# Patient Record
Sex: Male | Born: 1985 | Race: White | Hispanic: No | Smoking: Former smoker
Health system: Southern US, Community
[De-identification: ages and names within clinical notes are randomized; demographics above are authoritative.]

## PROBLEM LIST (undated history)

## (undated) ENCOUNTER — Emergency Department (HOSPITAL_COMMUNITY): Payer: Commercial Managed Care - HMO | Source: Home / Self Care

## (undated) DIAGNOSIS — K5900 Constipation, unspecified: Secondary | ICD-10-CM

## (undated) HISTORY — PX: WISDOM TOOTH EXTRACTION: SHX21

---
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
## 2012-09-23 ENCOUNTER — Ambulatory Visit (INDEPENDENT_AMBULATORY_CARE_PROVIDER_SITE_OTHER): Payer: BC Managed Care – PPO | Admitting: Family Medicine

## 2012-09-23 ENCOUNTER — Encounter: Payer: Self-pay | Admitting: Family Medicine

## 2012-09-23 VITALS — BP 139/87 | HR 89 | Temp 97.5°F | Ht 67.0 in | Wt 206.0 lb

## 2012-09-23 DIAGNOSIS — L723 Sebaceous cyst: Secondary | ICD-10-CM

## 2012-09-23 NOTE — Patient Instructions (Signed)

## 2012-09-23 NOTE — Progress Notes (Signed)
  Subjective:    Patient ID: Luis Simpson, male    DOB: January 31, 1986, 27 y.o.   MRN: [id number]  HPI This 27 y.o. male presents for evaluation of cyst left forearm.  He states he has a cyst on his Left forearm and he wants it cut out.  He has recently squeezed his forearm and got a seed out Of the cyst but feels like there is more in there.   Review of Systems C.o cyst left forearm.   No chest pain, SOB, HA, dizziness, vision change, N/V, diarrhea, constipation, dysuria, urinary urgency or frequency, myalgias, arthralgias or rash.  Objective:   Physical Exam  Vital signs noted  Well developed well nourished male.  HEENT - Head atraumatic Normocephalic Respiratory - Lungs CTA bilateral Cardiac - RRR S1 and S2 without murmur Skin - Left forearm with small cyst left forearm.  Left forearm is prepped with  Alcohol and lidocaine with 2% epi injected in a wheal fashion and then when Adequate anesthesia is acquired a small incision is made and no DC or cyst  Is appreciated and no discharge.  Bandaid is applied and patient reassured that The cyst is resolved.      Assessment & Plan:  Sebaceous cyst Incision and drainage performed and patient advised to follow up prn.

## 2012-09-24 ENCOUNTER — Ambulatory Visit: Payer: Self-pay | Admitting: Family Medicine

## 2013-01-26 ENCOUNTER — Ambulatory Visit (INDEPENDENT_AMBULATORY_CARE_PROVIDER_SITE_OTHER): Payer: BC Managed Care – PPO | Admitting: Family Medicine

## 2013-01-26 ENCOUNTER — Encounter: Payer: Self-pay | Admitting: Family Medicine

## 2013-01-26 VITALS — BP 145/76 | HR 114 | Temp 98.1°F | Ht 67.0 in | Wt 207.0 lb

## 2013-01-26 DIAGNOSIS — Z716 Tobacco abuse counseling: Secondary | ICD-10-CM

## 2013-01-26 DIAGNOSIS — J029 Acute pharyngitis, unspecified: Secondary | ICD-10-CM

## 2013-01-26 DIAGNOSIS — J069 Acute upper respiratory infection, unspecified: Secondary | ICD-10-CM

## 2013-01-26 DIAGNOSIS — Z7189 Other specified counseling: Secondary | ICD-10-CM

## 2013-01-26 DIAGNOSIS — F17200 Nicotine dependence, unspecified, uncomplicated: Secondary | ICD-10-CM

## 2013-01-26 LAB — POCT RAPID STREP A (OFFICE): Rapid Strep A Screen: NEGATIVE

## 2013-01-26 MED ORDER — BENZONATATE 100 MG PO CAPS: 100.0000 mg | ORAL_CAPSULE | Freq: Three times a day (TID) | ORAL | Status: DC | PRN

## 2013-01-26 NOTE — Progress Notes (Signed)
   Subjective:    Patient ID: Luis Simpson, male    DOB: [birth year], 27 y.o.   MRN: [id number]  HPI URI Symptoms Onset: 3 days  Description: rhinorrhea, nasal congestion, cough  Modifying factors:  1 ppd smoker, no wheezing   Symptoms Nasal discharge: yes Fever: tmax 99.8  Sore throat: yes Cough: yes Wheezing: no Ear pain: no GI symptoms: no Sick contacts: yes  Red Flags  Stiff neck: no Dyspnea: no Rash: no Swallowing difficulty: no  Sinusitis Risk Factors Headache/face pain: no Double sickening: no tooth pain: no  Allergy Risk Factors Sneezing: no Itchy scratchy throat: no Seasonal symptoms: no  Flu Risk Factors Headache: no muscle aches: no severe fatigue: no     Review of Systems  All other systems reviewed and are negative.       Objective:   Physical Exam  Constitutional: He is oriented to person, place, and time. He appears well-developed and well-nourished.  HENT:  Head: Normocephalic and atraumatic.  Right Ear: External ear normal.  Left Ear: External ear normal.  +nasal erythema, rhinorrhea bilaterally, + post oropharyngeal erythema    Eyes: Conjunctivae are normal. Pupils are equal, round, and reactive to light.  Neck: Normal range of motion.  Cardiovascular: Normal rate and regular rhythm.   Pulmonary/Chest: Effort normal.  Abdominal: Soft. Bowel sounds are normal.  Musculoskeletal: Normal range of motion.  Neurological: He is alert and oriented to person, place, and time.  Skin: Skin is warm.          Assessment & Plan:  Sore throat - Plan: POCT rapid strep A  URI (upper respiratory infection) - Plan: benzonatate (TESSALON) 100 MG capsule  Rapid strep negative Likely viral illness Discussed supportive care and infectious/resp red flags Tessalon perles for cough Discussed smoking cessation at length.  Follow up as needed.

## 2013-10-27 ENCOUNTER — Telehealth: Payer: Self-pay | Admitting: Family Medicine

## 2013-10-27 NOTE — Telephone Encounter (Signed)
Patient requested appt tomorrow am for pain in throat. appt given with bill oxford for 10am

## 2013-10-28 ENCOUNTER — Encounter: Payer: Self-pay | Admitting: Family Medicine

## 2013-10-28 ENCOUNTER — Ambulatory Visit (INDEPENDENT_AMBULATORY_CARE_PROVIDER_SITE_OTHER): Payer: BC Managed Care – PPO | Admitting: Family Medicine

## 2013-10-28 VITALS — BP 129/86 | HR 90 | Temp 97.3°F | Ht 67.0 in | Wt 211.0 lb

## 2013-10-28 DIAGNOSIS — J028 Acute pharyngitis due to other specified organisms: Secondary | ICD-10-CM

## 2013-10-28 DIAGNOSIS — J029 Acute pharyngitis, unspecified: Secondary | ICD-10-CM

## 2013-10-28 MED ORDER — FLUCONAZOLE 150 MG PO TABS: 150.0000 mg | ORAL_TABLET | Freq: Once | ORAL | Status: DC

## 2013-10-28 MED ORDER — AMOXICILLIN 875 MG PO TABS: 875.0000 mg | ORAL_TABLET | Freq: Two times a day (BID) | ORAL | Status: DC

## 2013-10-28 NOTE — Progress Notes (Signed)
   Subjective:    Patient ID: Luis Simpson, male    DOB: 1986/01/04, 28 y.o.   MRN: [id number]  HPI  This 28 y.o. male presents for evaluation of jaw discomfort and sore throat.  He has had wisdom teeth removed and was on abx's.  He still has some jaw pain and he has URI sx's.  He has been on PCN abx's recently.  Review of Systems C/o uri sx's and sore throat   No chest pain, SOB, HA, dizziness, vision change, N/V, diarrhea, constipation, dysuria, urinary urgency or frequency, myalgias, arthralgias or rash.  Objective:   Physical Exam Vital signs noted  Well developed well nourished male.  HEENT - Head atraumatic Normocephalic                Eyes - PERRLA, Conjuctiva - clear Sclera- Clear EOMI                Ears - EAC's Wnl TM's Wnl Gross Hearing WNL                Nose - Nares patent                 Throat - oropharanx injected and tonsils 2 plus Respiratory - Lungs CTA bilateral Cardiac - RRR S1 and S2 without murmur GI - Abdomen soft Nontender and bowel sounds active x 4 Extremities - No edema. Neuro - Grossly intact.  Results for orders placed in visit on 01/26/13  POCT RAPID STREP A (OFFICE)      Result Value Ref Range   Rapid Strep A Screen Negative  Negative       Assessment & Plan:  Acute pharyngitis due to other specified organisms - Plan: amoxicillin (AMOXIL) 875 MG tablet, fluconazole (DIFLUCAN) 150 MG  Po qd x 2 days and repeat after finishing amoxicillin.  WSWG's Push po fluids, rest, tylenol and motrin otc prn as directed for fever, arthralgias, and myalgias.  Follow up prn if sx's continue or persist.  Deatra Canter FNP

## 2013-11-04 ENCOUNTER — Telehealth: Payer: Self-pay | Admitting: Family Medicine

## 2013-11-04 NOTE — Telephone Encounter (Signed)
Please call prescription for Augmentin [phone] one twice daily for infection until complete

## 2013-11-04 NOTE — Telephone Encounter (Signed)
Called in.

## 2013-11-04 NOTE — Telephone Encounter (Signed)
Patient would like to have augmentin called in because the amox is not working at all for him

## 2014-04-15 DIAGNOSIS — Z720 Tobacco use: Secondary | ICD-10-CM | POA: Insufficient documentation

## 2014-04-15 DIAGNOSIS — K219 Gastro-esophageal reflux disease without esophagitis: Secondary | ICD-10-CM | POA: Insufficient documentation

## 2014-04-15 DIAGNOSIS — R202 Paresthesia of skin: Secondary | ICD-10-CM | POA: Insufficient documentation

## 2014-04-15 DIAGNOSIS — E669 Obesity, unspecified: Secondary | ICD-10-CM | POA: Insufficient documentation

## 2014-08-02 DIAGNOSIS — E781 Pure hyperglyceridemia: Secondary | ICD-10-CM | POA: Insufficient documentation

## 2015-09-13 ENCOUNTER — Emergency Department (HOSPITAL_COMMUNITY)
Admission: EM | Admit: 2015-09-13 | Discharge: 2015-09-14 | Disposition: A | Discharge status: Home / Self Care | Payer: Commercial Managed Care - HMO | Attending: Emergency Medicine | Admitting: Emergency Medicine

## 2015-09-13 ENCOUNTER — Encounter (HOSPITAL_COMMUNITY): Payer: Self-pay | Admitting: *Deleted

## 2015-09-13 ENCOUNTER — Telehealth: Payer: Self-pay | Admitting: *Deleted

## 2015-09-13 ENCOUNTER — Ambulatory Visit (INDEPENDENT_AMBULATORY_CARE_PROVIDER_SITE_OTHER): Payer: Commercial Managed Care - HMO | Admitting: Family Medicine

## 2015-09-13 ENCOUNTER — Other Ambulatory Visit: Payer: Self-pay | Admitting: Family Medicine

## 2015-09-13 ENCOUNTER — Encounter: Payer: Self-pay | Admitting: Family Medicine

## 2015-09-13 ENCOUNTER — Ambulatory Visit (INDEPENDENT_AMBULATORY_CARE_PROVIDER_SITE_OTHER): Payer: Commercial Managed Care - HMO

## 2015-09-13 VITALS — BP 132/91 | HR 73 | Temp 97.1°F | Ht 67.0 in | Wt 227.0 lb

## 2015-09-13 DIAGNOSIS — K6289 Other specified diseases of anus and rectum: Secondary | ICD-10-CM

## 2015-09-13 DIAGNOSIS — K5900 Constipation, unspecified: Secondary | ICD-10-CM | POA: Diagnosis not present

## 2015-09-13 DIAGNOSIS — R1030 Lower abdominal pain, unspecified: Secondary | ICD-10-CM

## 2015-09-13 DIAGNOSIS — Z87891 Personal history of nicotine dependence: Secondary | ICD-10-CM | POA: Insufficient documentation

## 2015-09-13 HISTORY — DX: Constipation, unspecified: K59.00

## 2015-09-13 LAB — URINALYSIS, ROUTINE W REFLEX MICROSCOPIC
Bilirubin Urine: NEGATIVE
GLUCOSE, UA: NEGATIVE mg/dL
HGB URINE DIPSTICK: NEGATIVE
Ketones, ur: NEGATIVE mg/dL
LEUKOCYTES UA: NEGATIVE
Nitrite: NEGATIVE
PROTEIN: 30 mg/dL — AB
SPECIFIC GRAVITY, URINE: 1.017 (ref 1.005–1.030)
pH: 7.5 (ref 5.0–8.0)

## 2015-09-13 LAB — COMPREHENSIVE METABOLIC PANEL
ALBUMIN: 4.2 g/dL (ref 3.5–5.0)
ALT: 147 U/L — ABNORMAL HIGH (ref 17–63)
ANION GAP: 7 (ref 5–15)
AST: 70 U/L — AB (ref 15–41)
Alkaline Phosphatase: 71 U/L (ref 38–126)
BUN: 7 mg/dL (ref 6–20)
CHLORIDE: 103 mmol/L (ref 101–111)
CO2: 27 mmol/L (ref 22–32)
Calcium: 9.3 mg/dL (ref 8.9–10.3)
Creatinine, Ser: 0.98 mg/dL (ref 0.61–1.24)
GFR calc Af Amer: >60 mL/min (ref >60)
GFR calc non Af Amer: >60 mL/min (ref >60)
GLUCOSE: 102 mg/dL — AB (ref 65–99)
POTASSIUM: 4.2 mmol/L (ref 3.5–5.1)
SODIUM: 137 mmol/L (ref 135–145)
TOTAL PROTEIN: 7.2 g/dL (ref 6.5–8.1)
Total Bilirubin: 1 mg/dL (ref 0.3–1.2)

## 2015-09-13 LAB — CBC WITH DIFFERENTIAL/PLATELET
BASOS ABS: 0 10*3/uL (ref 0.0–0.1)
Basophils Relative: 0 %
EOS ABS: 0.1 10*3/uL (ref 0.0–0.7)
EOS PCT: 1 %
HCT: 42.8 % (ref 39.0–52.0)
Hemoglobin: 15.5 g/dL (ref 13.0–17.0)
LYMPHS ABS: 1.3 10*3/uL (ref 0.7–4.0)
LYMPHS PCT: 17 %
MCH: 30 pg (ref 26.0–34.0)
MCHC: 36.2 g/dL — AB (ref 30.0–36.0)
MCV: 82.9 fL (ref 78.0–100.0)
MONO ABS: 0.5 10*3/uL (ref 0.1–1.0)
Monocytes Relative: 6 %
Neutro Abs: 6.1 10*3/uL (ref 1.7–7.7)
Neutrophils Relative %: 76 %
PLATELETS: 215 10*3/uL (ref 150–400)
RBC: 5.16 MIL/uL (ref 4.22–5.81)
RDW: 12.7 % (ref 11.5–15.5)
WBC: 8 10*3/uL (ref 4.0–10.5)

## 2015-09-13 LAB — URINE MICROSCOPIC-ADD ON

## 2015-09-13 MED ORDER — HYOSCYAMINE SULFATE 0.125 MG PO TBDP: 0.1250 mg | ORAL_TABLET | ORAL | 2 refills | Status: DC | PRN

## 2015-09-13 MED ORDER — MESALAMINE 1000 MG RE SUPP: 1000.0000 mg | Freq: Two times a day (BID) | RECTAL | 1 refills | Status: DC

## 2015-09-13 MED ORDER — POLYETHYLENE GLYCOL 3350 17 GM/SCOOP PO POWD: 17.0000 g | Freq: Two times a day (BID) | ORAL | 5 refills | Status: DC | PRN

## 2015-09-13 MED ORDER — BUDESONIDE 3 MG PO CPEP: 3.0000 mg | ORAL_CAPSULE | Freq: Two times a day (BID) | ORAL | 0 refills | Status: DC

## 2015-09-13 NOTE — Telephone Encounter (Signed)
Pt notified of RX Verbalizes understanding 

## 2015-09-13 NOTE — Progress Notes (Signed)
Subjective:  Patient ID: Luis Simpson, male    DOB: Aug 10, 1985  Age: 30 y.o. MRN: [id number]  CC: Constipation (x 2-3 days)   HPI Luis Simpson presents for Recurring constipation he estimates it could be anywhere from 6 months to year. He used to have bowel movements 2-3 times a day. However, he is concerned for an anal fissure. He says that his bowel movements have become painful. Because of that he tends to hold them longer. Then they get hard and they hurt when he passes him. Recently he's had to use more more laxatives including magnesium citrate. He tried some last night and had some bowel movement including some mild diarrhea but feels full like he can't get it all out. His stool is laced with blood when he does pass it. He has a bloated full sensation in the abdomen and significant lower abdomen pain with each bowel movement.   History [name] has no past medical history on file.   He has no past surgical history on file.   His family history is not on file.He reports that he has been smoking Cigarettes.  He has been smoking about 1.00 pack per day. He has never used smokeless tobacco. He reports that he drinks alcohol. He reports that he does not use drugs.  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  ROS Review of Systems  Constitutional: Negative for chills, diaphoresis, fever and unexpected weight change.  HENT: Negative for trouble swallowing.   Respiratory: Negative for cough, chest tightness and shortness of breath.   Cardiovascular: Negative for chest pain.  Gastrointestinal: Positive for abdominal distention, abdominal pain, anal bleeding, blood in stool, constipation, diarrhea and rectal pain. Negative for nausea and vomiting.  Genitourinary: Negative for dysuria, flank pain and hematuria.  Musculoskeletal: Negative for arthralgias and joint swelling.  Skin: Negative for rash.  Neurological: Negative for syncope  and headaches.    Objective:  BP (!) 132/91   Pulse 73   Temp 97.1 F (36.2 C) (Oral)   Ht 5\' 7"  (1.702 m)   Wt 227 lb (103 kg)   SpO2 98%   BMI 35.55 kg/m   BP Readings from Last 3 Encounters:  09/13/15 (!) 132/91  10/28/13 129/86  01/26/13 (!) 145/76    Wt Readings from Last 3 Encounters:  09/13/15 227 lb (103 kg)  10/28/13 211 lb (95.7 kg)  01/26/13 207 lb (93.9 kg)     Physical Exam  Constitutional: He is oriented to person, place, and time. He appears well-developed and well-nourished. No distress.  HENT:  Head: Normocephalic and atraumatic.  Right Ear: External ear normal.  Left Ear: External ear normal.  Nose: Nose normal.  Mouth/Throat: Oropharynx is clear and moist.  Eyes: Conjunctivae and EOM are normal. Pupils are equal, round, and reactive to light.  Neck: Normal range of motion. Neck supple. No thyromegaly present.  Cardiovascular: Normal rate, regular rhythm and normal heart sounds.   No murmur heard. Pulmonary/Chest: Effort normal and breath sounds normal. No respiratory distress. He has no wheezes. He has no rales.  Abdominal: Soft. Bowel sounds are normal. He exhibits distension. He exhibits no mass. There is tenderness. There is no rebound and no guarding.  Lymphadenopathy:    He has no cervical adenopathy.  Neurological: He is alert and oriented to person, place, and time. He has normal reflexes.  Skin: Skin is warm and dry.  Psychiatric: He has a normal mood and  affect. His behavior is normal. Judgment and thought content normal.     No results found for: WBC, HGB, HCT, PLT, GLUCOSE, CHOL, TRIG, HDL, LDLDIRECT, LDLCALC, ALT, AST, NA, K, CL, CREATININE, BUN, CO2, TSH, PSA, INR, GLUF, HGBA1C, MICROALBUR  No results found.  Assessment & Plan:   Matvey was seen today for constipation.  Diagnoses and all orders for this visit:  Proctitis  Lower abdominal pain -     DG Abd 1 View  Constipation, unspecified constipation type -     DG Abd  1 View  Other orders -     Discontinue: mesalamine (CANASA) 1000 MG suppository; Place 1 suppository (1,000 mg total) rectally 2 (two) times daily. -     polyethylene glycol powder (GLYCOLAX/MIRALAX) powder; Take 17 g by mouth 2 (two) times daily as needed for moderate constipation. For constipation      I have discontinued Mr. Robitaille amoxicillin, fluconazole, and mesalamine. I am also having him start on polyethylene glycol powder.  Meds ordered this encounter  Medications  . DISCONTD: mesalamine (CANASA) 1000 MG suppository    Sig: Place 1 suppository (1,000 mg total) rectally 2 (two) times daily.    Dispense:  30 suppository    Refill:  1  . polyethylene glycol powder (GLYCOLAX/MIRALAX) powder    Sig: Take 17 g by mouth 2 (two) times daily as needed for moderate constipation. For constipation    Dispense:  3350 g    Refill:  5     Follow-up: Return if symptoms worsen or fail to improve.  Mechele Claude, M.D.

## 2015-09-13 NOTE — Telephone Encounter (Signed)
The requested med has been sent to the pharmacy.  Please let the patient know. Thanks, WS 

## 2015-09-13 NOTE — ED Triage Notes (Signed)
Patient presents with c/o being constipated for about 2-3 days.  Has a history of the same

## 2015-09-13 NOTE — Telephone Encounter (Signed)
Pt's wife called in to request medication for pt States pt is in a lot of pain and needs something for immediate help for constipation Please advise

## 2015-09-14 ENCOUNTER — Emergency Department (HOSPITAL_COMMUNITY): Payer: Commercial Managed Care - HMO

## 2015-09-14 MED ORDER — DIPHENHYDRAMINE HCL 50 MG/ML IJ SOLN
12.5000 mg | Freq: Once | INTRAMUSCULAR | Status: AC
  Administered 2015-09-14: 12.5 mg via INTRAVENOUS
  Filled 2015-09-14: qty 1

## 2015-09-14 MED ORDER — IOPAMIDOL (ISOVUE-300) INJECTION 61%
INTRAVENOUS | Status: AC
  Administered 2015-09-14: 100 mL
  Filled 2015-09-14: qty 100

## 2015-09-14 MED ORDER — POLYETHYLENE GLYCOL 3350 17 G PO PACK: PACK | ORAL | 0 refills | Status: DC

## 2015-09-14 MED ORDER — DEXAMETHASONE SODIUM PHOSPHATE 10 MG/ML IJ SOLN
10.0000 mg | Freq: Once | INTRAMUSCULAR | Status: AC
  Administered 2015-09-14: 10 mg via INTRAVENOUS
  Filled 2015-09-14: qty 1

## 2015-09-14 MED ORDER — HYDROCORTISONE ACETATE 25 MG RE SUPP: 25.0000 mg | Freq: Two times a day (BID) | RECTAL | 0 refills | Status: DC

## 2015-09-14 MED ORDER — METOCLOPRAMIDE HCL 5 MG/ML IJ SOLN
10.0000 mg | Freq: Once | INTRAMUSCULAR | Status: AC
  Administered 2015-09-14: 10 mg via INTRAVENOUS
  Filled 2015-09-14: qty 2

## 2015-09-14 NOTE — ED Notes (Signed)
Pt verbalized understanding of discharge instructions and follow-up care. Denies further questions at this time. 

## 2015-09-14 NOTE — ED Provider Notes (Signed)
[facility]-EMERGENCY DEPT Provider Note   CSN: [id number] Arrival date & time: 09/13/15  2212  First Provider Contact:  First MD Initiated Contact with Patient 09/14/15 [phone]        History   Chief Complaint Chief Complaint  Patient presents with  . Other    Constipation    HPI Luis Simpson is a 30 y.o. male.  Patient presents with severe rectal pain and constipation. His last bowel movement was 3 days ago. His normal is 1-2 movements daily. No new medications or narcotic use. He started having nausea with vomiting today and reports a low grade fever at home. No blood per rectum. He feels the need to pass a stool but "can't push it out". He was seen by his primary care doctor earlier today and reports imaging that showed constipation. He states his PCP tried to do a digital rectal exam but he could not tolerate it secondary to rectal pain. He was sent here for further evaluation.    The history is provided by the patient and the spouse. No language interpreter was used.    Past Medical History:  Diagnosis Date  . Constipation     There are no active problems to display for this patient.   Past Surgical History:  Procedure Laterality Date  . WISDOM TOOTH EXTRACTION         Home Medications    Prior to Admission medications   Medication Sig Start Date End Date Taking? Authorizing Provider  budesonide (ENTOCORT EC) 3 MG 24 hr capsule Take 1 capsule (3 mg total) by mouth 2 (two) times daily. 09/13/15  Yes Mechele Claude, MD  polyethylene glycol powder (GLYCOLAX/MIRALAX) powder Take 17 g by mouth 2 (two) times daily as needed for moderate constipation. For constipation 09/13/15  Yes Mechele Claude, MD  hyoscyamine (NULEV) 0.125 MG TBDP disintergrating tablet Place 1 tablet (0.125 mg total) under the tongue every 4 (four) hours as needed (abd pain from constipation). 09/13/15   Mechele Claude, MD    Family History No family history on file.  Social History Social History    Substance Use Topics  . Smoking status: Former Smoker    Packs/day: 1.00    Types: Cigarettes    Quit date: 02/19/2015  . Smokeless tobacco: Never Used  . Alcohol use Yes     Comment: rarely     Allergies   Review of patient's allergies indicates no known allergies.   Review of Systems Review of Systems  Constitutional: Negative for chills and fever.  Gastrointestinal: Positive for constipation, rectal pain and vomiting. Negative for abdominal pain and blood in stool.  Genitourinary: Negative.  Negative for decreased urine volume, difficulty urinating and dysuria.  Musculoskeletal: Negative.  Negative for back pain and myalgias.  Neurological: Negative.  Negative for weakness.     Physical Exam Updated Vital Signs BP 140/85   Pulse 80   Temp 98 F (36.7 C) (Oral)   Resp 10   Ht 5\' 6"  (1.676 m)   Wt 102.1 kg   SpO2 98%   BMI 36.32 kg/m   Physical Exam  Constitutional: He appears well-developed and well-nourished.  Uncomfortable appearing.  HENT:  Head: Normocephalic.  Neck: Normal range of motion. Neck supple.  Cardiovascular: Normal rate and regular rhythm.   Pulmonary/Chest: Effort normal and breath sounds normal. No respiratory distress.  Abdominal: Soft. Bowel sounds are normal. There is no tenderness. There is no rebound and no guarding.  Genitourinary:  Genitourinary Comments: No external  rectal swelling, hemorrhoids, fissures, or perirectal inflammation. Attempt at digital exam failed secondary to patient not being able to tolerate exam. Stool at rectum is brown in color.  Musculoskeletal: Normal range of motion.  Neurological: He is alert. No cranial nerve deficit.  Skin: Skin is warm and dry. No rash noted.  Psychiatric: He has a normal mood and affect.     ED Treatments / Results  Labs (all labs ordered are listed, but only abnormal results are displayed) Labs Reviewed  CBC WITH DIFFERENTIAL/PLATELET - Abnormal; Notable for the following:        Result Value   MCHC 36.2 (*)    All other components within normal limits  COMPREHENSIVE METABOLIC PANEL - Abnormal; Notable for the following:    Glucose, Bld 102 (*)    AST 70 (*)    ALT 147 (*)    All other components within normal limits  URINALYSIS, ROUTINE W REFLEX MICROSCOPIC (NOT AT [facility]) - Abnormal; Notable for the following:    Protein, ur 30 (*)    All other components within normal limits  URINE MICROSCOPIC-ADD ON - Abnormal; Notable for the following:    Squamous Epithelial / LPF 0-5 (*)    Bacteria, UA RARE (*)    All other components within normal limits   Results for orders placed or performed during the hospital encounter of 09/13/15  CBC with Differential  Result Value Ref Range   WBC 8.0 4.0 - 10.5 K/uL   RBC 5.16 4.22 - 5.81 MIL/uL   Hemoglobin 15.5 13.0 - 17.0 g/dL   HCT 16.1 09.6 - 04.5 %   MCV 82.9 78.0 - 100.0 fL   MCH 30.0 26.0 - 34.0 pg   MCHC 36.2 (H) 30.0 - 36.0 g/dL   RDW 40.9 81.1 - 91.4 %   Platelets 215 150 - 400 K/uL   Neutrophils Relative % 76 %   Neutro Abs 6.1 1.7 - 7.7 K/uL   Lymphocytes Relative 17 %   Lymphs Abs 1.3 0.7 - 4.0 K/uL   Monocytes Relative 6 %   Monocytes Absolute 0.5 0.1 - 1.0 K/uL   Eosinophils Relative 1 %   Eosinophils Absolute 0.1 0.0 - 0.7 K/uL   Basophils Relative 0 %   Basophils Absolute 0.0 0.0 - 0.1 K/uL  Comprehensive metabolic panel  Result Value Ref Range   Sodium 137 135 - 145 mmol/L   Potassium 4.2 3.5 - 5.1 mmol/L   Chloride 103 101 - 111 mmol/L   CO2 27 22 - 32 mmol/L   Glucose, Bld 102 (H) 65 - 99 mg/dL   BUN 7 6 - 20 mg/dL   Creatinine, Ser 7.82 0.61 - 1.24 mg/dL   Calcium 9.3 8.9 - 95.6 mg/dL   Total Protein 7.2 6.5 - 8.1 g/dL   Albumin 4.2 3.5 - 5.0 g/dL   AST 70 (H) 15 - 41 U/L   ALT 147 (H) 17 - 63 U/L   Alkaline Phosphatase 71 38 - 126 U/L   Total Bilirubin 1.0 0.3 - 1.2 mg/dL   GFR calc non Af Amer >60 >60 mL/min   GFR calc Af Amer >60 >60 mL/min   Anion gap 7 5 - 15  Urinalysis,  Routine w reflex microscopic (not at [facility])  Result Value Ref Range   Color, Urine YELLOW YELLOW   APPearance CLEAR CLEAR   Specific Gravity, Urine 1.017 1.005 - 1.030   pH 7.5 5.0 - 8.0   Glucose, UA NEGATIVE NEGATIVE mg/dL  Hgb urine dipstick NEGATIVE NEGATIVE   Bilirubin Urine NEGATIVE NEGATIVE   Ketones, ur NEGATIVE NEGATIVE mg/dL   Protein, ur 30 (A) NEGATIVE mg/dL   Nitrite NEGATIVE NEGATIVE   Leukocytes, UA NEGATIVE NEGATIVE  Urine microscopic-add on  Result Value Ref Range   Squamous Epithelial / LPF 0-5 (A) NONE SEEN   WBC, UA 0-5 0 - 5 WBC/hpf   RBC / HPF 0-5 0 - 5 RBC/hpf   Bacteria, UA RARE (A) NONE SEEN    EKG  EKG Interpretation None       Radiology Dg Abd 1 View  Result Date: 09/13/2015 CLINICAL DATA:  Constipation EXAM: ABDOMEN - 1 VIEW COMPARISON:  None. FINDINGS: No significant increase in stool burden. There is a non obstructive bowel gas pattern. No supine evidence of free air. No organomegaly or suspicious calcification.No acute bony abnormality. IMPRESSION: Negative. Electronically Signed   By: Charlett Nose M.D.   On: 09/13/2015 08:28  Ct Pelvis W Contrast  Result Date: 09/14/2015 CLINICAL DATA:  30 year old male with constipation. EXAM: CT PELVIS WITH CONTRAST TECHNIQUE: Multidetector CT imaging of the pelvis was performed using the standard protocol following the bolus administration of intravenous contrast. CONTRAST:  ISOVUE-300 IOPAMIDOL (ISOVUE-300) INJECTION 61% COMPARISON:  Abdominal radiograph dated 09/13/2015 FINDINGS: There is no free air or free fluid in the visualized pelvis. The urinary bladder, prostate, seminal vesicles are grossly unremarkable. There is moderate stool in the visualized colon. The visualized bowel are not dilated. Normal appendix. The distal aorta and the iliac arteries appear unremarkable. No adenopathy identified. There is a small fat containing right inguinal hernia. There is mild haziness of the subcutaneous soft  tissues of the anterior pelvic wall. No fluid collection. Small fat containing umbilical hernia. The osseous structures are intact. IMPRESSION: Moderate stool within the visualized colon otherwise unremarkable CT of the pelvis. Electronically Signed   By: Elgie Collard M.D.   On: 09/14/2015 02:38   Procedures Procedures (including critical care time)  Medications Ordered in ED Medications  dexamethasone (DECADRON) injection 10 mg (10 mg Intravenous Given 09/14/15 0055)  diphenhydrAMINE (BENADRYL) injection 12.5 mg (12.5 mg Intravenous Given 09/14/15 0055)  metoCLOPramide (REGLAN) injection 10 mg (10 mg Intravenous Given 09/14/15 0055)  iopamidol (ISOVUE-300) 61 % injection (100 mLs  Contrast Given 09/14/15 0218)     Initial Impression / Assessment and Plan / ED Course  I have reviewed the triage vital signs and the nursing notes.  Pertinent labs & imaging results that were available during my care of the patient were reviewed by me and considered in my medical decision making (see chart for details).  Clinical Course    Patient presents with severe rectal pain leading to constipation. CT pelvis performed showing retained stool; no evidence abscess, proctitis, obstruction or mass. Discussed findings with patient and wife as well as treatment measures to relieve constipation.   Patient complained of a headache - resolved with headache cocktail. No neurologic deficits.   Final Clinical Impressions(s) / ED Diagnoses   Final diagnoses:  Rectal pain  Constipation  New Prescriptions New Prescriptions   No medications on file     Elpidio Anis, Cordelia Poche 09/14/15 0358    Shon Baton, MD 09/18/15 2304

## 2015-09-25 ENCOUNTER — Telehealth: Payer: Self-pay | Admitting: Family Medicine

## 2015-09-25 NOTE — Telephone Encounter (Signed)
Pt aware.

## 2015-09-25 NOTE — Telephone Encounter (Signed)
Per [facility] last OV note - 09/13/15 - he was having many GI issues  Can you place GI referral for pt?   Coverage for [facility]

## 2015-09-25 NOTE — Telephone Encounter (Signed)
He would need to be seen for a referral. From notes and imaging review it appears he is having problems with constipation, CT scan looked good other than the stool burden, no other cause for pain. Need to get him stooling 2-3 times a day soft loose bowel movements. Can take miralax every 2-4 hours as needed during day. Drink lots of water with it.

## 2015-09-29 ENCOUNTER — Encounter: Payer: Self-pay | Admitting: Internal Medicine

## 2015-09-29 ENCOUNTER — Ambulatory Visit (INDEPENDENT_AMBULATORY_CARE_PROVIDER_SITE_OTHER): Payer: Commercial Managed Care - HMO | Admitting: Family Medicine

## 2015-09-29 ENCOUNTER — Encounter: Payer: Self-pay | Admitting: Family Medicine

## 2015-09-29 DIAGNOSIS — R198 Other specified symptoms and signs involving the digestive system and abdomen: Secondary | ICD-10-CM | POA: Insufficient documentation

## 2015-09-29 NOTE — Progress Notes (Signed)
   HPI  Patient presents today here with alternating diarrhea and constipation.  Patient reports 6 months history of crampy abdominal pain, intermittent bloody stools, and alternating constipation and diarrhea.  He has most persistent constipation with episodes of diarrhea several times a month. He has had a crampy abdominal pains that wake him up in the middle the night frequently.  Has no problem tolerating food or fluids.  He's been to the emergency room on one occasion for this and worked up using a CT and labs which were all negative.  Gross bloody stools as well as blood-streaked and having some blood dripping in the toilet. This is been intermittent over the last 6 months. None currently   PMH: Smoking status noted ROS: Per HPI  Objective: BP 134/76   Pulse 100   Temp (!) 96.8 F (36 C) (Oral)   Ht 5\' 6"  (1.676 m)   Wt 226 lb 3.2 oz (102.6 kg)   BMI 36.51 kg/m  Gen: NAD, alert, cooperative with exam HEENT: NCAT CV: RRR, good S1/S2, no murmur Resp: CTABL, no wheezes, non-labored Abd: SNTND, BS present, no guarding or organomegaly Ext: No edema, warm Neuro: Alert and oriented, No gross deficits  Assessment and plan:  # Alternating constipation and diarrhea Patient with six-month history of irritating constipation and diarrhea. He has some alternating hematochezia as well as nighttime abdominal cramps. He's had reasonable workup here I recommended referral to GI to consider colonoscopy to rule out inflammatory bowel disease. His abdominal exam is very reassuring today. Appreciate GI recommendations.      Orders Placed This Encounter  Procedures  . Ambulatory referral to Gastroenterology    Referral Priority:   Routine    Referral Type:   Consultation    Referral Reason:   Specialty Services Required    Number of Visits Requested:   1     Murtis SinkSam Akaisha Truman, MD [facility] 09/29/2015, 8:54 AM

## 2015-09-29 NOTE — Patient Instructions (Signed)
Great to meet you!  You will hear form us in a week or so about a GI appointment.   Try to keep up the miralax 1 capful a day, wait 3-4 days for results, go down to 1/2 cap or up to 2 caps/day to get 1 easy stool daily.

## 2015-12-07 ENCOUNTER — Encounter: Payer: Self-pay | Admitting: Internal Medicine

## 2015-12-07 ENCOUNTER — Encounter (INDEPENDENT_AMBULATORY_CARE_PROVIDER_SITE_OTHER): Payer: Self-pay

## 2015-12-07 ENCOUNTER — Ambulatory Visit (INDEPENDENT_AMBULATORY_CARE_PROVIDER_SITE_OTHER): Payer: Commercial Managed Care - HMO | Admitting: Internal Medicine

## 2015-12-07 VITALS — BP 120/68 | HR 80 | Ht 67.0 in | Wt 224.0 lb

## 2015-12-07 DIAGNOSIS — K602 Anal fissure, unspecified: Secondary | ICD-10-CM | POA: Diagnosis not present

## 2015-12-07 DIAGNOSIS — K625 Hemorrhage of anus and rectum: Secondary | ICD-10-CM | POA: Diagnosis not present

## 2015-12-07 DIAGNOSIS — K5900 Constipation, unspecified: Secondary | ICD-10-CM

## 2015-12-07 DIAGNOSIS — K6289 Other specified diseases of anus and rectum: Secondary | ICD-10-CM | POA: Diagnosis not present

## 2015-12-07 MED ORDER — DILTIAZEM GEL 2 %: 1.0000 "application " | Freq: Every day | CUTANEOUS | 3 refills | Status: DC

## 2015-12-07 NOTE — Patient Instructions (Signed)
We have sent the following medications to your pharmacy for you to pick up at your convenience: Diltiazem  Take Metamucil 1-2 tablespoons a day  Use Sitz Baths

## 2015-12-07 NOTE — Progress Notes (Signed)
HISTORY OF PRESENT ILLNESS:  Luis Simpson is a 30 y.o. male who is referred by his primary care provider Dr. Ermalinda MemosBradshaw regarding constipation, rectal pain, and rectal bleeding. The patient reports a 6-12 month history of intermittent problems with rectal pain associated with defecation and accompanied by rectal bleeding. The blood is bright red. No abdominal pain. Occasional bloating. Occasional loose stools. Evaluated in the emergency room 09/13/2015. Reviewed. Rectal examination revealed no external abnormalities. The exam was said to have failed secondary to the patient not being able to tolerate the exam. He tells me this was secondary to pain. Stool was brown. CBC unremarkable including hemoglobin of 15.5. He did undergo a CT scan of the pelvis 09/14/2015. This revealed moderate stool in the visualized colon but was otherwise unremarkable. He saw Dr. Ermalinda MemosBradshaw in August. GI consultation arranged.  REVIEW OF SYSTEMS:  All non-GI ROS negative upon comprehensive review  Past Medical History:  Diagnosis Date  . Constipation     Past Surgical History:  Procedure Laterality Date  . WISDOM TOOTH EXTRACTION      Social History Luis BaldyReginald K Ketchum  reports that he quit smoking about 10 months ago. His smoking use included Cigarettes. He has a 15.00 pack-year smoking history. He has never used smokeless tobacco. He reports that he drinks alcohol. He reports that he does not use drugs.  family history is not on file.  No Known Allergies     PHYSICAL EXAMINATION: Vital signs: BP 120/68 (BP Location: Left Arm, Patient Position: Sitting, Cuff Size: Normal)   Pulse 80   Ht 5\' 7"  (1.702 m)   Wt 224 lb (101.6 kg)   BMI 35.08 kg/m   Constitutional: generally well-appearing, no acute distress Psychiatric: alert and oriented x3, cooperative Eyes: extraocular movements intact, anicteric, conjunctiva pink Mouth: oral pharynx moist, no lesions Neck: supple no lymphadenopathy Cardiovascular: heart  regular rate and rhythm, no murmur Lungs: clear to auscultation bilaterally Abdomen: soft, nontender, nondistended, no obvious ascites, no peritoneal signs, normal bowel sounds, no organomegaly Rectal:Anterior fissure. Tender. brown stool Extremities: no Cyanosis or lower extremity edema bilaterally Skin: no lesions on visible extremities Neuro: No focal deficits.   ASSESSMENT:  #1. Rectal fissure to explain rectal pain and bleeding #2. Constipation   PLAN:  #1. Metamucil 2 tablespoons daily #2. Sitz baths daily #3. 2% diltiazem ointment 5 times daily as directed #4. Contact this office if the problem persists despite completion of medical therapy. We discussed the potential role of surgery  A copy of this consultation note has been sent to Dr. Ermalinda MemosBradshaw

## 2016-03-29 ENCOUNTER — Encounter: Payer: Self-pay | Admitting: Family Medicine

## 2016-03-29 ENCOUNTER — Ambulatory Visit (INDEPENDENT_AMBULATORY_CARE_PROVIDER_SITE_OTHER): Payer: Commercial Managed Care - HMO | Admitting: Family Medicine

## 2016-03-29 VITALS — BP 136/85 | HR 123 | Temp 98.4°F | Ht 67.0 in | Wt 228.4 lb

## 2016-03-29 DIAGNOSIS — J111 Influenza due to unidentified influenza virus with other respiratory manifestations: Secondary | ICD-10-CM | POA: Diagnosis not present

## 2016-03-29 MED ORDER — OSELTAMIVIR PHOSPHATE 75 MG PO CAPS: 75.0000 mg | ORAL_CAPSULE | Freq: Two times a day (BID) | ORAL | 0 refills | Status: DC

## 2016-03-29 NOTE — Progress Notes (Signed)
   HPI  Patient presents today with flu-like symps  A shunt has had approximately 12 hours onset of body aches, chills, mild shortness of breath, mild cough, and fever measured at 102 last night.  Patient had sudden onset symptoms last night and called EMS. He was found to have a pulse of 150 and a fever of 102. He has mild cough and shortness of breath but states that these are not major problems.  He is tolerating food and fluids normally. He supposed to work all weekend.  PMH: Smoking status noted ROS: Per HPI  Objective: BP 136/85   Pulse (!) 123   Temp 98.4 F (36.9 C) (Oral)   Ht 5\' 7"  (1.702 m)   Wt 228 lb 6.4 oz (103.6 kg)   BMI 35.77 kg/m  Gen: NAD, alert, cooperative with exam HEENT: NCAT, oropharynx moist and clear, TMs normal bilaterally CV: RRR, good S1/S2, no murmur Resp: CTABL, no wheezes, non-labored Ext: No edema, warm Neuro: Alert and oriented, No gross deficits  Assessment and plan:  # Influenza Clinical diagnosis, treatment with Tamiflu Discussed supportive care including use of antipyretics. Note written for work 5 days out of work Recommended low threshold for follow-up if any additional symptoms develop.    Meds ordered this encounter  Medications  . oseltamivir (TAMIFLU) 75 MG capsule    Sig: Take 1 capsule (75 mg total) by mouth 2 (two) times daily.    Dispense:  10 capsule    Refill:  0    Luis SinkSam Breylan Lefevers, MD [facility] 03/29/2016, 8:48 AM

## 2016-03-29 NOTE — Patient Instructions (Signed)
Great to see you!  Come back with any concerns  Start tamiflu today   Influenza, Adult Influenza, more commonly known as "the flu," is a viral infection that primarily affects the respiratory tract. The respiratory tract includes organs that help you breathe, such as the lungs, nose, and throat. The flu causes many common cold symptoms, as well as a high fever and body aches. The flu spreads easily from person to person (is contagious). Getting a flu shot (influenza vaccination) every year is the best way to prevent influenza. What are the causes? Influenza is caused by a virus. You can catch the virus by:  Breathing in droplets from an infected person's cough or sneeze.  Touching something that was recently contaminated with the virus and then touching your mouth, nose, or eyes. What increases the risk? The following factors may make you more likely to get the flu:  Not cleaning your hands frequently with soap and water or alcohol-based hand sanitizer.  Having close contact with many people during cold and flu season.  Touching your mouth, eyes, or nose without washing or sanitizing your hands first.  Not drinking enough fluids or not eating a healthy diet.  Not getting enough sleep or exercise.  Being under a high amount of stress.  Not getting a yearly (annual) flu shot. You may be at a higher risk of complications from the flu, such as a severe lung infection (pneumonia), if you:  Are over the age of 22.  Are pregnant.  Have a weakened disease-fighting system (immune system). You may have a weakened immune system if you:  Have HIV or AIDS.  Are undergoing chemotherapy.  Aretaking medicines that reduce the activity of (suppress) the immune system.  Have a long-term (chronic) illness, such as heart disease, kidney disease, diabetes, or lung disease.  Have a liver disorder.  Are obese.  Have anemia. What are the signs or symptoms? Symptoms of this condition  typically last 4-10 days and may include:  Fever.  Chills.  Headache, body aches, or muscle aches.  Sore throat.  Cough.  Runny or congested nose.  Chest discomfort and cough.  Poor appetite.  Weakness or tiredness (fatigue).  Dizziness.  Nausea or vomiting. How is this diagnosed? This condition may be diagnosed based on your medical history and a physical exam. Your health care provider may do a nose or throat swab test to confirm the diagnosis. How is this treated? If influenza is detected early, you can be treated with antiviral medicine that can reduce the length of your illness and the severity of your symptoms. This medicine may be given by mouth (orally) or through an IV tube that is inserted in one of your veins. The goal of treatment is to relieve symptoms by taking care of yourself at home. This may include taking over-the-counter medicines, drinking plenty of fluids, and adding humidity to the air in your home. In some cases, influenza goes away on its own. Severe influenza or complications from influenza may be treated in a hospital. Follow these instructions at home:  Take over-the-counter and prescription medicines only as told by your health care provider.  Use a cool mist humidifier to add humidity to the air in your home. This can make breathing easier.  Rest as needed.  Drink enough fluid to keep your urine clear or pale yellow.  Cover your mouth and nose when you cough or sneeze.  Wash your hands with soap and water often, especially after you cough  or sneeze. If soap and water are not available, use hand sanitizer.  Stay home from work or school as told by your health care provider. Unless you are visiting your health care provider, try to avoid leaving home until your fever has been gone for 24 hours without the use of medicine.  Keep all follow-up visits as told by your health care provider. This is important. How is this prevented?  Getting an  annual flu shot is the best way to avoid getting the flu. You may get the flu shot in late summer, fall, or winter. Ask your health care provider when you should get your flu shot.  Wash your hands often or use hand sanitizer often.  Avoid contact with people who are sick during cold and flu season.  Eat a healthy diet, drink plenty of fluids, get enough sleep, and exercise regularly. Contact a health care provider if:  You develop new symptoms.  You have:  Chest pain.  Diarrhea.  A fever.  Your cough gets worse.  You produce more mucus.  You feel nauseous or you vomit. Get help right away if:  You develop shortness of breath or difficulty breathing.  Your skin or nails turn a bluish color.  You have severe pain or stiffness in your neck.  You develop a sudden headache or sudden pain in your face or ear.  You cannot stop vomiting. This information is not intended to replace advice given to you by your health care provider. Make sure you discuss any questions you have with your health care provider. Document Released: 02/02/2000 Document Revised: 07/13/2015 Document Reviewed: 11/29/2014 Elsevier Interactive Patient Education  2017 [employer]

## 2016-05-25 ENCOUNTER — Ambulatory Visit (INDEPENDENT_AMBULATORY_CARE_PROVIDER_SITE_OTHER): Payer: Commercial Managed Care - HMO | Admitting: Pediatrics

## 2016-05-25 ENCOUNTER — Encounter: Payer: Self-pay | Admitting: Pediatrics

## 2016-05-25 VITALS — BP 134/89 | HR 104 | Temp 97.5°F | Ht 67.0 in | Wt 228.6 lb

## 2016-05-25 DIAGNOSIS — R52 Pain, unspecified: Secondary | ICD-10-CM | POA: Diagnosis not present

## 2016-05-25 DIAGNOSIS — B349 Viral infection, unspecified: Secondary | ICD-10-CM

## 2016-05-25 DIAGNOSIS — R509 Fever, unspecified: Secondary | ICD-10-CM | POA: Diagnosis not present

## 2016-05-25 NOTE — Progress Notes (Signed)
  Subjective:   Patient ID: Luis Simpson, male    DOB: Nov 18, 1985, 31 y.o.   MRN: [id number] CC: Fever; Chills; Generalized Body Aches; and Emesis  HPI: Luis Simpson is a 30 y.o. male presenting for Fever; Chills; Generalized Body Aches; and Emesis  Went to bed last night feeling fine, normal self Woke up in the middle of the night with fever up 105 checked with no touch forehead thermometer No coughing, URI symptoms, dysuria, abd pain, no vomiting Just fever and body aches Had flu a couple months ago, felt like this  Some nausea, not very different than usual feeling that comes and goes No rash Took ibuprofen and tylenol last night, fever resolved by this morning Feeling fine now, slightly weak hasnt eaten anything yet this morning  Relevant past medical, surgical, family and social history reviewed. Allergies and medications reviewed and updated. History  Smoking Status  . Former Smoker  . Packs/day: 1.00  . Years: 15.00  . Types: Cigarettes  . Quit date: 01/20/2015  Smokeless Tobacco  . Never Used   ROS: Per HPI   Objective:    BP 134/89   Pulse (!) 104   Temp 97.5 F (36.4 C) (Oral)   Ht  (1.702 m)   Wt 228 lb 9.6 oz (103.7 kg)   BMI 35.80 kg/m   Wt Readings from Last 3 Encounters:  05/25/16 228 lb 9.6 oz (103.7 kg)  03/29/16 228 lb 6.4 oz (103.6 kg)  12/07/15 224 lb (101.6 kg)    Gen: NAD, alert, cooperative with exam, NCAT EYES: EOMI, no conjunctival injection, or no icterus ENT:  TMs pearly gray b/l, OP without erythema LYMPH: no cervical LAD CV: NRRR, normal S1/S2, no murmur, distal pulses 2+ b/l Resp: CTABL, no wheezes, normal WOB Abd: +BS, soft, NTND. no guarding or organomegaly Ext: No edema, warm Neuro: Alert and oriented, strength equal b/l UE and LE, coordination grossly normal MSK: normal muscle bulk Skin: no rash  Assessment & Plan:  [name] was seen today for fever, chills, generalized body aches and emesis.  Diagnoses and all  orders for this visit:  Body aches -     Veritor Flu A/B Waived  Fever, unspecified fever cause -     Urinalysis  Viral illness Non-focal exam Feeling fine now No fevers now UA normal, flu neg Discussed return precautions, cont tylenol/ibuprofen prn  Follow up plan: prn Rex Kras, MD [facility] [place]

## 2016-05-27 LAB — VERITOR FLU A/B WAIVED
INFLUENZA A: NEGATIVE
INFLUENZA B: NEGATIVE

## 2016-05-27 LAB — URINALYSIS
Bilirubin, UA: NEGATIVE
GLUCOSE, UA: NEGATIVE
KETONES UA: NEGATIVE
LEUKOCYTES UA: NEGATIVE
Nitrite, UA: NEGATIVE
RBC, UA: NEGATIVE
Specific Gravity, UA: >1.03 — ABNORMAL HIGH (ref 1.005–1.030)
UUROB: 0.2 mg/dL (ref 0.2–1.0)
pH, UA: 5.5 (ref 5.0–7.5)

## 2016-07-02 ENCOUNTER — Emergency Department (HOSPITAL_COMMUNITY)
Admission: EM | Admit: 2016-07-02 | Discharge: 2016-07-02 | Disposition: A | Discharge status: Home / Self Care | Payer: Commercial Managed Care - HMO | Attending: Emergency Medicine | Admitting: Emergency Medicine

## 2016-07-02 ENCOUNTER — Encounter (HOSPITAL_COMMUNITY): Payer: Self-pay | Admitting: Nurse Practitioner

## 2016-07-02 ENCOUNTER — Telehealth: Payer: Self-pay | Admitting: Gastroenterology

## 2016-07-02 ENCOUNTER — Emergency Department (HOSPITAL_COMMUNITY): Payer: Commercial Managed Care - HMO

## 2016-07-02 DIAGNOSIS — Z87891 Personal history of nicotine dependence: Secondary | ICD-10-CM | POA: Insufficient documentation

## 2016-07-02 DIAGNOSIS — R1030 Lower abdominal pain, unspecified: Secondary | ICD-10-CM | POA: Diagnosis not present

## 2016-07-02 LAB — URINALYSIS, ROUTINE W REFLEX MICROSCOPIC
BACTERIA UA: NONE SEEN
Bilirubin Urine: NEGATIVE
Glucose, UA: NEGATIVE mg/dL
KETONES UR: NEGATIVE mg/dL
Leukocytes, UA: NEGATIVE
Nitrite: NEGATIVE
PROTEIN: 100 mg/dL — AB
SQUAMOUS EPITHELIAL / LPF: NONE SEEN
Specific Gravity, Urine: 1.021 (ref 1.005–1.030)
pH: 6 (ref 5.0–8.0)

## 2016-07-02 LAB — CBC WITH DIFFERENTIAL/PLATELET
Basophils Absolute: 0 10*3/uL (ref 0.0–0.1)
Basophils Relative: 0 %
Eosinophils Absolute: 0 10*3/uL (ref 0.0–0.7)
Eosinophils Relative: 0 %
HEMATOCRIT: 40.1 % (ref 39.0–52.0)
HEMOGLOBIN: 13.8 g/dL (ref 13.0–17.0)
LYMPHS ABS: 0.7 10*3/uL (ref 0.7–4.0)
Lymphocytes Relative: 10 %
MCH: 29.2 pg (ref 26.0–34.0)
MCHC: 34.4 g/dL (ref 30.0–36.0)
MCV: 84.8 fL (ref 78.0–100.0)
MONO ABS: 0.4 10*3/uL (ref 0.1–1.0)
Monocytes Relative: 6 %
NEUTROS ABS: 5.9 10*3/uL (ref 1.7–7.7)
NEUTROS PCT: 84 %
Platelets: 164 10*3/uL (ref 150–400)
RBC: 4.73 MIL/uL (ref 4.22–5.81)
RDW: 13.1 % (ref 11.5–15.5)
WBC: 7 10*3/uL (ref 4.0–10.5)

## 2016-07-02 LAB — COMPREHENSIVE METABOLIC PANEL
ALBUMIN: 3.7 g/dL (ref 3.5–5.0)
ALK PHOS: 65 U/L (ref 38–126)
ALT: 94 U/L — ABNORMAL HIGH (ref 17–63)
ANION GAP: 10 (ref 5–15)
AST: 61 U/L — ABNORMAL HIGH (ref 15–41)
BUN: 10 mg/dL (ref 6–20)
CALCIUM: 8.3 mg/dL — AB (ref 8.9–10.3)
CO2: 20 mmol/L — AB (ref 22–32)
CREATININE: 0.99 mg/dL (ref 0.61–1.24)
Chloride: 106 mmol/L (ref 101–111)
GFR calc Af Amer: >60 mL/min (ref >60)
GFR calc non Af Amer: >60 mL/min (ref >60)
GLUCOSE: 112 mg/dL — AB (ref 65–99)
Potassium: 3.5 mmol/L (ref 3.5–5.1)
SODIUM: 136 mmol/L (ref 135–145)
Total Bilirubin: 0.8 mg/dL (ref 0.3–1.2)
Total Protein: 6.9 g/dL (ref 6.5–8.1)

## 2016-07-02 LAB — I-STAT CG4 LACTIC ACID, ED: Lactic Acid, Venous: 1.6 mmol/L (ref 0.5–1.9)

## 2016-07-02 MED ORDER — POLYETHYLENE GLYCOL 3350 17 G PO PACK: 17.0000 g | PACK | Freq: Every day | ORAL | 0 refills | Status: DC

## 2016-07-02 MED ORDER — MORPHINE SULFATE (PF) 4 MG/ML IV SOLN
4.0000 mg | Freq: Once | INTRAVENOUS | Status: AC
  Administered 2016-07-02: 4 mg via INTRAVENOUS
  Filled 2016-07-02: qty 1

## 2016-07-02 MED ORDER — ONDANSETRON HCL 4 MG/2ML IJ SOLN
4.0000 mg | Freq: Once | INTRAMUSCULAR | Status: AC
  Administered 2016-07-02: 4 mg via INTRAVENOUS
  Filled 2016-07-02: qty 2

## 2016-07-02 MED ORDER — SODIUM CHLORIDE 0.9 % IV BOLUS (SEPSIS)
1000.0000 mL | Freq: Once | INTRAVENOUS | Status: AC
  Administered 2016-07-02: 1000 mL via INTRAVENOUS

## 2016-07-02 MED ORDER — SUCRALFATE 1 GM/10ML PO SUSP: 1.0000 g | Freq: Three times a day (TID) | ORAL | 0 refills | Status: DC

## 2016-07-02 MED ORDER — SENNOSIDES-DOCUSATE SODIUM 8.6-50 MG PO TABS
1.0000 | ORAL_TABLET | Freq: Every day | ORAL | 0 refills | Status: DC
Start: 2016-07-02 — End: 2016-07-04

## 2016-07-02 MED ORDER — ACETAMINOPHEN 325 MG PO TABS
ORAL_TABLET | ORAL | Status: AC
  Filled 2016-07-02: qty 2

## 2016-07-02 MED ORDER — ACETAMINOPHEN 325 MG PO TABS
650.0000 mg | ORAL_TABLET | Freq: Once | ORAL | Status: AC
  Administered 2016-07-02: 650 mg via ORAL

## 2016-07-02 MED ORDER — IOPAMIDOL (ISOVUE-300) INJECTION 61%
INTRAVENOUS | Status: AC
  Administered 2016-07-02: 100 mL
  Filled 2016-07-02: qty 100

## 2016-07-02 MED ORDER — DICYCLOMINE HCL 20 MG PO TABS: 20.0000 mg | ORAL_TABLET | Freq: Two times a day (BID) | ORAL | 0 refills | Status: DC

## 2016-07-02 NOTE — ED Provider Notes (Signed)
Emergency Department Provider Note   I have reviewed the triage vital signs and the nursing notes.   HISTORY  Chief Complaint Abdominal Pain   HPI Luis Simpson is a 31 y.o. male with PMH of constipation presents to the emergency department for evaluation of lower abdominal pain for the past 3 days with associated fever, body aches, shaking chills developing over the last 24 hours. States he is very physically active 3 days ago and after dinner began having some lower abdominal discomfort. It continued throughout the night and into the next day at which point he became febrile. He is having body aches and shaking chills with continued lower abdominal pain. No dysuria but is endorsing some focal suprapubic pain. His testicles are somewhat uncomfortable and he has noticed that they more withdrawn than normal. She denies any rash. No vomiting. Patient did have a bowel movement this morning with some streaks of blood which is not uncommon for him with his constipation. The patient went to urgent care prior to ED presentation. He tested negative for the flu. Also negative for strep throat. Denies any known tick bite.    Past Medical History:  Diagnosis Date  . Constipation     Patient Active Problem List   Diagnosis Date Noted  . Alternating constipation and diarrhea 09/29/2015    Past Surgical History:  Procedure Laterality Date  . WISDOM TOOTH EXTRACTION      Current Outpatient Rx  . Order #: [id number] Class: Historical Med  . Order #: [id number] Class: Print  . Order #: [id number] Class: Historical Med  . Order #: [id number] Class: Historical Med  . Order #: [id number] Class: Print  . Order #: [id number] Class: Print  . Order #: [id number] Class: Print    Allergies Patient has no known allergies.  Family History  Problem Relation Age of Onset  . Colon cancer Neg Hx     Social History Social History  Substance Use Topics  . Smoking status: Former Smoker    Packs/day: 1.00      Years: 15.00    Types: Cigarettes    Quit date: 01/20/2015  . Smokeless tobacco: Never Used  . Alcohol use No     Comment: rarely    Review of Systems  Constitutional: Positive fever/chills and body aches.  Eyes: No visual changes. ENT: No sore throat. Cardiovascular: Denies chest pain. Respiratory: Denies shortness of breath. Gastrointestinal: Positive lower abdominal pain.  No nausea, no vomiting.  No diarrhea. Positive constipation. Genitourinary: Negative for dysuria. Musculoskeletal: Negative for back pain. Skin: Negative for rash. Neurological: Negative for headaches, focal weakness or numbness.  10-point ROS otherwise negative.  ____________________________________________   PHYSICAL EXAM:  VITAL SIGNS: ED Triage Vitals  Enc Vitals Group     BP 07/02/16 1310 (!) 143/77     Pulse Rate 07/02/16 1310 (!) 124     Resp 07/02/16 1310 20     Temp 07/02/16 1310 (!) 101 F (38.3 C)     Temp Source 07/02/16 1310 Oral     SpO2 07/02/16 1310 100 %     Weight 07/02/16 1309 225 lb (102.1 kg)     Height 07/02/16 1309 5\' 6"  (1.676 m)     Pain Score 07/02/16 1315 8   Constitutional: Alert and oriented. Well appearing but seems uncomfortable.  Eyes: Conjunctivae are normal. Head: Atraumatic. Nose: No congestion/rhinnorhea. Mouth/Throat: Mucous membranes are moist.  Oropharynx non-erythematous. Neck: No stridor.   Cardiovascular: Normal rate, regular rhythm. Good peripheral circulation. Grossly normal heart sounds.  Respiratory: Normal respiratory effort.  No retractions. Lungs CTAB. Gastrointestinal: Soft with diffuse lower abdominal tenderness. No rebound or guarding. No distention.  Genitourinary: Normal testicle exam. No scrotal edema or cellulitis. No masses or obvious hernia.  Musculoskeletal: No lower extremity tenderness nor edema. No gross deformities of extremities. Neurologic:  Normal speech and language. No gross focal neurologic deficits are appreciated.   Skin:  Skin is warm, dry and intact. No rash noted. Psychiatric: Mood and affect are normal. Speech and behavior are normal.  ____________________________________________   LABS (all labs ordered are listed, but only abnormal results are displayed)  Labs Reviewed  COMPREHENSIVE METABOLIC PANEL - Abnormal; Notable for the following:       Result Value   CO2 20 (*)    Glucose, Bld 112 (*)    Calcium 8.3 (*)    AST 61 (*)    ALT 94 (*)    All other components within normal limits  URINALYSIS, ROUTINE W REFLEX MICROSCOPIC - Abnormal; Notable for the following:    Hgb urine dipstick SMALL (*)    Protein, ur 100 (*)    All other components within normal limits  URINE CULTURE  CULTURE, BLOOD (ROUTINE X 2)  CULTURE, BLOOD (ROUTINE X 2)  CBC WITH DIFFERENTIAL/PLATELET  I-STAT CG4 LACTIC ACID, ED   ____________________________________________  RADIOLOGY  Ct Abdomen Pelvis W Contrast  Result Date: 07/02/2016 CLINICAL DATA:  Lower abdominal pain radiating down to general poles and nausea for 4 days. History of constipation. EXAM: CT ABDOMEN AND PELVIS WITH CONTRAST TECHNIQUE: Multidetector CT imaging of the abdomen and pelvis was performed using the standard protocol following bolus administration of intravenous contrast. CONTRAST:  100mL ISOVUE-300 IOPAMIDOL (ISOVUE-300) INJECTION 61% COMPARISON:  CT pelvis dated 09/14/2015. FINDINGS: Lower chest: No acute abnormality. Hepatobiliary: Liver is low in density suggesting fatty infiltration. Gallbladder appears normal. No bile duct dilatation. Pancreas: Unremarkable. No pancreatic ductal dilatation or surrounding inflammatory changes. Spleen: Normal in size without focal abnormality. Adrenals/Urinary Tract: Adrenal glands appear normal. Kidneys appear normal without mass, stone or hydronephrosis. No ureteral or bladder calculi identified. Bladder appears normal. Stomach/Bowel: Bowel is normal in caliber. No bowel wall thickening or evidence of  bowel wall inflammation seen. Moderate amount of stool and gas throughout the nondistended colon. Small appendicolith near the tip of the appendix, but appendix is normal in size/thickness and there is no evidence of acute appendicitis. Vascular/Lymphatic: No significant vascular findings are present. No enlarged abdominal or pelvic lymph nodes. Reproductive: Prostate is unremarkable. Other: No free fluid or abscess collection. No free intraperitoneal air. Musculoskeletal: No acute or significant osseous finding. Superficial soft tissues are unremarkable. Small periumbilical abdominal wall hernia which contains fat only. Small bilateral inguinal hernias which contain fat only. IMPRESSION: 1. No acute findings within the abdomen or pelvis. Moderate amount of stool and gas throughout the nondistended colon (constipation? ). No bowel obstruction or evidence of bowel wall inflammation. No renal or ureteral calculi. 2. Probable fatty infiltration of the liver. 3. Small calcified appendicolith near the tip of the appendix. Appendix appears otherwise normal. No evidence of appendicitis. Electronically Signed   By: Bary RichardStan  Maynard M.D.   On: 07/02/2016 16:28    ____________________________________________   PROCEDURES  Procedure(s) performed:   Procedures  None ____________________________________________   INITIAL IMPRESSION / ASSESSMENT AND PLAN / ED COURSE  Pertinent labs & imaging results that were available during my care of the patient were reviewed by me and considered in my medical decision making (see chart  for details).  Patient presents to the emergency department for evaluation of abdominal discomfort and fever. On exam the patient's abdominal pain is difficult to localize. He does seem especially tender in the suprapubic region. Normal GU exam. No surgical history. Plan for labs, blood cultures, CT scan of the abdomen and pelvis to rule out appendicitis versus colitis.   No acute findings  on CT. Plan for symptom treatment at home and GI follow up. Patient has seen GI previously for abdominal/rectal pain and was diagnosed with anal fissures at that time. No evidence on exam or labs to suggest GU source. Presentation certainly not consistent with torsion.   At this time, I do not feel there is any life-threatening condition present. I have reviewed and discussed all results (EKG, imaging, lab, urine as appropriate), exam findings with patient. I have reviewed nursing notes and appropriate previous records.  I feel the patient is safe to be discharged home without further emergent workup. Discussed usual and customary return precautions. Patient and family (if present) verbalize understanding and are comfortable with this plan.  Patient will follow-up with their primary care provider. If they do not have a primary care provider, information for follow-up has been provided to them. All questions have been answered.  ____________________________________________  FINAL CLINICAL IMPRESSION(S) / ED DIAGNOSES  Final diagnoses:  Lower abdominal pain     MEDICATIONS GIVEN DURING THIS VISIT:  Medications  acetaminophen (TYLENOL) tablet 650 mg (650 mg Oral Given 07/02/16 1327)  sodium chloride 0.9 % bolus 1,000 mL (0 mLs Intravenous Stopped 07/02/16 1521)  morphine 4 MG/ML injection 4 mg (4 mg Intravenous Given 07/02/16 1445)  ondansetron (ZOFRAN) injection 4 mg (4 mg Intravenous Given 07/02/16 1445)  iopamidol (ISOVUE-300) 61 % injection (100 mLs  Contrast Given 07/02/16 1607)     NEW OUTPATIENT MEDICATIONS STARTED DURING THIS VISIT:  Discharge Medication List as of 07/02/2016  4:57 PM    START taking these medications   Details  dicyclomine (BENTYL) 20 MG tablet Take 1 tablet (20 mg total) by mouth 2 (two) times daily., Starting Tue 07/02/2016, Print    senna-docusate (SENOKOT-S) 8.6-50 MG tablet Take 1 tablet by mouth daily., Starting Tue 07/02/2016, Until Tue 07/09/2016, Print      sucralfate (CARAFATE) 1 GM/10ML suspension Take 10 mLs (1 g total) by mouth 4 (four) times daily -  with meals and at bedtime., Starting Tue 07/02/2016, Print          Note:  This document was prepared using Dragon voice recognition software and may include unintentional dictation errors.  Alona Bene, MD Emergency Medicine   Grabiela Wohlford, Arlyss Repress, MD 07/02/16 [id number]

## 2016-07-02 NOTE — ED Triage Notes (Addendum)
Pt presents with c/o abdominal pain. The pain began on this past saturday and has been increasingly worse since onset. The pain is in his lower abdomen under his umbilical area. He reports anorexia, nausea, decreased urination, groin pain, constipation. He has tried miralax with a bowel movement this morning. [facility] [place] and was sent to ED for further evaluation. He was sent here with copies of CBC, BMP, UA, Strep, Flu and abdominal Xray results.

## 2016-07-02 NOTE — Telephone Encounter (Signed)
JP I believe this is your patient from prior clinic visit last fall.   Patient's wife paged me earlier this evening to discuss this patient who was in the ER this evening. She states he was there with lower abdominal pain, constipation, nausea, fever. Labs and CT scan in the ER were unremarkable for acute pathology to account for his symptoms. Chronic elevation in ALT noted. ER staff felt comfortable sending the patient home, got him feeling better with their treatment. Patient's wife asked for our evaluation in the near future for reassessment. She asked me to clarify cause for his symptoms tonight,  told her the workup seemed reassuring tonight but he would need to be seen in person to clarify etiology, hard to say over the phone without evaluating him and getting more history. ER staff did not contact me for consultation, but I said I was happy to speak with them if needed or desired.  I told her if the patient is not being admitted our office will contact him in the morning for reassessment and see if he needs an office visit in the near future.

## 2016-07-02 NOTE — ED Notes (Signed)
Dr Long at bedside

## 2016-07-02 NOTE — Discharge Instructions (Signed)

## 2016-07-03 ENCOUNTER — Ambulatory Visit (INDEPENDENT_AMBULATORY_CARE_PROVIDER_SITE_OTHER): Payer: Commercial Managed Care - HMO | Admitting: Internal Medicine

## 2016-07-03 ENCOUNTER — Ambulatory Visit: Payer: Commercial Managed Care - HMO | Admitting: Nurse Practitioner

## 2016-07-03 ENCOUNTER — Telehealth: Payer: Self-pay | Admitting: Internal Medicine

## 2016-07-03 ENCOUNTER — Encounter: Payer: Self-pay | Admitting: Internal Medicine

## 2016-07-03 VITALS — BP 132/72 | HR 100 | Temp 100.4°F | Ht 66.0 in | Wt 219.8 lb

## 2016-07-03 DIAGNOSIS — K5900 Constipation, unspecified: Secondary | ICD-10-CM

## 2016-07-03 DIAGNOSIS — R1084 Generalized abdominal pain: Secondary | ICD-10-CM | POA: Diagnosis not present

## 2016-07-03 DIAGNOSIS — K612 Anorectal abscess: Secondary | ICD-10-CM | POA: Diagnosis not present

## 2016-07-03 LAB — URINE CULTURE
Culture: NO GROWTH
Special Requests: NORMAL

## 2016-07-03 MED ORDER — CIPROFLOXACIN HCL 500 MG PO TABS: 500.0000 mg | ORAL_TABLET | Freq: Two times a day (BID) | ORAL | 0 refills | Status: DC

## 2016-07-03 MED ORDER — METRONIDAZOLE 500 MG PO TABS: 500.0000 mg | ORAL_TABLET | Freq: Two times a day (BID) | ORAL | 0 refills | Status: DC

## 2016-07-03 NOTE — Patient Instructions (Addendum)
We have sent the following medications to your pharmacy for you to pick up at your convenience: Cipro and Flagyl  Take Tylenol for fever and sitz baths several times a day  You will receive a call from [facility] to schedule a time for you to come in

## 2016-07-03 NOTE — Telephone Encounter (Signed)
[name], Please see Dr. Lanetta InchArmbruster's message. If this patient is not an inpatient, it him an office appointment with me or an extender. Thanks

## 2016-07-03 NOTE — Telephone Encounter (Signed)
Pt already scheduled to see Dr. Marina GoodellPerry today.

## 2016-07-03 NOTE — Progress Notes (Signed)
HISTORY OF PRESENT ILLNESS:  Luis Simpson is a 31 y.o. male who has been evaluated previously (October 2017) for constipation and rectal fissure. He presents today as an urgent add-on regarding abdominal pain for which she was evaluated in the ER earlier today. Reports ongoing problems with constipation. Had problems with lower abdominal discomfort and testicular discomfort over the weekend. Had not moved his bowels in several days. Subsequently developed problems with fevers. Took several doses of MiraLAX. Mentions tenderness around his anus. Was not examined in that area in the ER. He is accompanied today by his wife and daughter  REVIEW OF SYSTEMS:  All non-GI ROS negative unless otherwise stated in history of present illness except for fatigue, headaches  Past Medical History:  Diagnosis Date  . Constipation     Past Surgical History:  Procedure Laterality Date  . WISDOM TOOTH EXTRACTION      Social History Luis BaldyReginald K Fernandez  reports that he quit smoking about 17 months ago. His smoking use included Cigarettes. He has a 15.00 pack-year smoking history. He has never used smokeless tobacco. He reports that he does not drink alcohol or use drugs.  family history includes Diverticulitis in his father.  No Known Allergies     PHYSICAL EXAMINATION: Vital signs: BP 132/72   Pulse 100   Temp (!) 100.4 F (38 C)   Ht 5\' 6"  (1.676 m)   Wt 219 lb 12.8 oz (99.7 kg)   BMI 35.48 kg/m   Constitutional: generally well-appearing, no acute distress Psychiatric: alert and oriented x3, cooperative Eyes: extraocular movements intact, anicteric, conjunctiva pink Mouth: oral pharynx moist, no lesions Neck: supple no lymphadenopathy Cardiovascular: heart regular rate and rhythm, no murmur Lungs: clear to auscultation bilaterally Abdomen: soft, Mild abdominal wall tenderness in the lower portions, nondistended, no obvious ascites, no peritoneal signs, normal bowel sounds, no  organomegaly Rectal: 1 cm Tender perirectal abscess with ulcerative surface. Tried to expel pus but could not. Extremities: no clubbing cyanosis or lower extremity edema bilaterally Skin: no lesions on visible extremities Neuro: No focal deficits. Cranial nerves intact  ASSESSMENT:  #1. Perirectal abscess. This explains pararectal plane and fever #2. Abdominal pain. Muscle wall tenderness. #3. Chronic constipation. Ongoing   PLAN:  #1. Prescribe ciprofloxacin 500 mg twice a day 1 week #2. Prescribe metronidazole 500 mg twice a day 1 week #3. Sitz baths twice daily #4. Referral over to Gen. surgery work in clinic today as he may need I&D #5. Advised how to use MiraLAX more regularly and titrate dose to achieve desired result #6. General follow-up as needed.

## 2016-07-03 NOTE — Telephone Encounter (Signed)
Spoke with pts wife and let her know the information has been faxed twice to [facility]. Spoke with Lupita Leashonna at [facility] and they have been faxed the information and Alonna BucklerLeslie Wrenn CMA spoke with the office also. While on the phone with pts wife [facility] called and she hung up to speak with [facility].

## 2016-07-04 ENCOUNTER — Ambulatory Visit (INDEPENDENT_AMBULATORY_CARE_PROVIDER_SITE_OTHER): Payer: Commercial Managed Care - HMO | Admitting: Family Medicine

## 2016-07-04 VITALS — BP 147/94 | HR 99 | Temp 97.0°F | Ht 66.0 in | Wt 219.4 lb

## 2016-07-04 DIAGNOSIS — K611 Rectal abscess: Secondary | ICD-10-CM | POA: Diagnosis not present

## 2016-07-04 MED ORDER — ONDANSETRON HCL 4 MG PO TABS: 4.0000 mg | ORAL_TABLET | Freq: Three times a day (TID) | ORAL | 0 refills | Status: DC | PRN

## 2016-07-04 NOTE — Progress Notes (Signed)
   HPI  Patient presents today here for follow-up of abdominal pain.  Patient was seen in the emergency room with fever and abdominal pain. He was then seen by GI one day ago and found to have a perirectal abscess. He was treated with initially Cipro and Flagyl and then the abscess was drained by general surgery. They recommended he take Augmentin which she has started.  Today he feels somewhat better, he still has some myalgias. He had a fever measured at 105 last night. This came down with Tylenol and cold water.  He's tolerating food and fluids by mouth normally. He denies shortness breath or chest pain.  He does have some nausea with hydrocodone, he's considering quitting it as he is improving.  PMH: Smoking status noted ROS: Per HPI  Objective: BP (!) 147/94   Pulse 99   Temp 97 F (36.1 C) (Oral)   Ht 5\' 6"  (1.676 m)   Wt 219 lb 6.4 oz (99.5 kg)   BMI 35.41 kg/m  Gen: NAD, alert, cooperative with exam HEENT: NCAT CV: RRR, good S1/S2, no murmur Resp: CTABL, no wheezes, non-labored Abd: SNTND, BS present, no guarding or organomegaly Ext: No edema, warm Neuro: Alert and oriented, No gross deficits  Assessment and plan:  # Abdominal pain, nausea, pararectal abscess Patient improving after treatment for peri-rectal abscess.  Continue augmentin, appreciate GI and Gen Surg's care.  Low threshold for return with very high fever, however he is markedly improving today.   Zofran given for nausea, he will cut down norco dose to 2.5 mg and try a trial off.   Meds ordered this encounter  Medications  . amoxicillin (AMOXIL) 500 MG capsule    Sig: Take 500 mg by mouth 3 (three) times daily.  Marland Kitchen. HYDROcodone-acetaminophen (NORCO/VICODIN) 5-325 MG tablet    Sig: Take 1 tablet by mouth every 6 (six) hours as needed for moderate pain.  Marland Kitchen. ondansetron (ZOFRAN) 4 MG tablet    Sig: Take 1 tablet (4 mg total) by mouth every 8 (eight) hours as needed for nausea or vomiting.    Dispense:   20 tablet    Refill:  0    Murtis SinkSam Bradshaw, MD [facility] 07/04/2016, 11:57 AM

## 2016-07-04 NOTE — Patient Instructions (Signed)
Great to see you!  I am glad you are getting better, please let me know if anything else changes.

## 2016-07-07 LAB — CULTURE, BLOOD (ROUTINE X 2)
Culture: NO GROWTH
Culture: NO GROWTH
Special Requests: ADEQUATE
Special Requests: ADEQUATE

## 2016-11-04 ENCOUNTER — Encounter: Payer: Self-pay | Admitting: Family Medicine

## 2016-11-04 ENCOUNTER — Ambulatory Visit (INDEPENDENT_AMBULATORY_CARE_PROVIDER_SITE_OTHER): Payer: Commercial Managed Care - HMO | Admitting: Family Medicine

## 2016-11-04 VITALS — BP 150/94 | HR 108 | Temp 98.1°F | Ht 66.0 in | Wt 223.8 lb

## 2016-11-04 DIAGNOSIS — K6289 Other specified diseases of anus and rectum: Secondary | ICD-10-CM

## 2016-11-04 DIAGNOSIS — R52 Pain, unspecified: Secondary | ICD-10-CM

## 2016-11-04 LAB — VERITOR FLU A/B WAIVED
INFLUENZA A: NEGATIVE
Influenza B: NEGATIVE

## 2016-11-04 MED ORDER — AMOXICILLIN-POT CLAVULANATE 875-125 MG PO TABS: 1.0000 | ORAL_TABLET | Freq: Two times a day (BID) | ORAL | 0 refills | Status: DC

## 2016-11-04 NOTE — Progress Notes (Signed)
   HPI  Patient presents today here with body aches and chills with rectal pain.  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 Patient states these had rectal pain for a couple of days since worsening. He also complains of headache, chills, and body aches starting last night. This is similar to previous rectal abscess development.  A shunt is still struggling with constipation, He took 4 capfuls of mural ask yesterday with good results followed by 5-6 episodes of watery stools.  He started an old Augmentin this morning.  PMH: Smoking status noted ROS: Per HPI  Objective: BP (!) 150/94   Pulse (!) 108   Temp 98.1 F (36.7 C) (Oral)   Ht  (1.676 m)   Wt 223 lb 12.8 oz (101.5 kg)   BMI 36.12 kg/m  Gen: NAD, alert, cooperative with exam HEENT: NCAT CV: RRR, good S1/S2, no murmur Resp: CTABL, no wheezes, non-labored Ext: No edema, warm Neuro: Alert and oriented, No gross deficits DRE: Tenderness of the rectum without hypertonicity, difficult to examine fully, no clear abscess, fissure, or screen rectal pathology  Assessment and plan:  # Rectal pain, body aches Rapid flu is negative Given history of rectal abscess with similar beginnings, recommended Augmentin RTC with any worsening or failure to improve.    Orders Placed This Encounter  Procedures  . Veritor Flu A/B Waived    Order Specific Question:   Source    Answer:   nasal    Meds ordered this encounter  Medications  . amoxicillin-clavulanate (AUGMENTIN) 875-125 MG tablet    Sig: Take 1 tablet by mouth 2 (two) times daily.    Dispense:  20 tablet    Refill:  0    Murtis Sink, MD [place] [facility] 11/04/2016, 11:42 AM

## 2016-11-04 NOTE — Patient Instructions (Signed)
Geat to see you!  Come back or call with any concerns I have started you on augmentin to treat a possible recurrent infection.

## 2016-11-29 ENCOUNTER — Ambulatory Visit (HOSPITAL_COMMUNITY)
Admission: RE | Admit: 2016-11-29 | Discharge: 2016-11-29 | Disposition: A | Discharge status: Home / Self Care | Payer: 59 | Source: Ambulatory Visit | Attending: Family Medicine | Admitting: Family Medicine

## 2016-11-29 ENCOUNTER — Ambulatory Visit (INDEPENDENT_AMBULATORY_CARE_PROVIDER_SITE_OTHER): Payer: 59 | Admitting: Family Medicine

## 2016-11-29 ENCOUNTER — Encounter: Payer: Self-pay | Admitting: Family Medicine

## 2016-11-29 ENCOUNTER — Encounter (HOSPITAL_COMMUNITY): Payer: Self-pay

## 2016-11-29 VITALS — BP 131/87 | HR 100 | Temp 97.0°F | Ht 66.0 in | Wt 231.6 lb

## 2016-11-29 DIAGNOSIS — K6289 Other specified diseases of anus and rectum: Secondary | ICD-10-CM | POA: Diagnosis not present

## 2016-11-29 DIAGNOSIS — K760 Fatty (change of) liver, not elsewhere classified: Secondary | ICD-10-CM | POA: Diagnosis not present

## 2016-11-29 MED ORDER — IOPAMIDOL (ISOVUE-300) INJECTION 61%: 100.0000 mL | Freq: Once | INTRAVENOUS | Status: DC | PRN

## 2016-11-29 MED ORDER — AMOXICILLIN-POT CLAVULANATE 875-125 MG PO TABS: 1.0000 | ORAL_TABLET | Freq: Two times a day (BID) | ORAL | 0 refills | Status: DC

## 2016-11-29 MED ORDER — IOPAMIDOL (ISOVUE-300) INJECTION 61%
INTRAVENOUS | Status: AC
  Filled 2016-11-29: qty 30

## 2016-11-29 NOTE — Progress Notes (Signed)
Luis Simpson 11/29/2016 11:29 AM Location: [facility] Patient #: [id number] DOB: 04/10/1985 Married / Language: English / Race: White Male   History of Present Illness  The patient is a 31 year old male who presents with anal pain. PMH of anal fissure and perianal abscess, presenting to Urgent office with anal pain and intermittent bleeding x several months. He was seen in the office by Dr. Derrell Lolling on 07/03/16, at which time an I&D was performed and purulent material was evacuated from an area on his left lateral perianal region. There was a suspicion for superficial fistula at that time. The patient states he has been experiencing anal pain, especially with bowel movements, off and on since then.   He was seen by Dr. Ermalinda Memos, his PCP, on 11/04/16 for body aches/chills/rectal pain. He was given Augmentin which he has now completed, but the pain has been persistent. In the past several weeks he has experienced more discomfort, and he developed fevers/chills/body aches today. He saw Dr. Ermalinda Memos again this morning, who was concerned for recurrent deep abscess and sent him to [facility] for further evaluation.  He states he has 1 soft bowel movement a day with the help of Metamucil and MiraLAX, however last night he took a large amount of Miralax and experienced heavy bleeding with the BM. He noticed bleeding throughout the night as well, but it has now subsided.  He has never had a colonoscopy. Denies weight loss and family history of colorectal cancer.   Allergies  No Known Allergies 07/03/2016 Allergies Reconciled   Medication History  No Current Medications Medications Reconciled  Vitals 11/29/2016 11:29 AM Weight: 230.6 lb Height: 67in Body Surface Area: 2.15 m Body Mass Index: 36.12 kg/m  Temp.: 98.49F  Pulse: 78 (Regular)  BP: 138/91 (Sitting, Left Arm, Standard)   Physical Exam  Rectal Note: No external physical exam  findings - no external hemorrhoids, erythema, fluctuance, induration  ?Possible anterior anal fissure  Digital rectal exam attempted but limited due to intolerance by patient   Assessment & Plan  ANAL PAIN (K62.89) Dr. Cliffton Asters also evaluated the patient with me. Given his history of previous perianal abscess, and development of fever/chills today with no definitive external physical exam findings, he was advised to go to the ER to have CT abdomen and pelvis with IV contrast and labs to evaluate for a deeper abscess.  If the CT is negative, we will treat for a fissure, as the PE suggested a possible anterior fissure.   Regardless of the CT findings, we highly recommended a colonoscopy given his history of intermittent rectal bleeding with bowel movements for the past few months.  The patient provided verbal understanding and will go to the ER immediately.  Signed electronically by Tsosie Billing, PA-C (11/29/2016 12:26 PM) [facility]

## 2016-11-29 NOTE — Progress Notes (Addendum)
   HPI  Patient presents today here with concern for rectal abscess.  Patient explains that yesterday he had a normal bowel movement, throughout the day he began to develop pain. He took 6 capfuls of miralax and devloped rectal bleeding.   He has an appoint with Gen surg this am and stops to ask my opinion.   He has had subjective fever and chills this am.   He feels he saw something round and smooth at the anus squatting and looking with a mirror.   He had a rectal abscess on 07/03/2016, has had 1 concern for recurrence doing well with augmentin last month.   PMH: Smoking status noted ROS: Per HPI  Objective: BP 131/87   Pulse 100   Temp (!) 97 F (36.1 C) (Oral)   Ht  (1.676 m)   Wt 231 lb 9.6 oz (105.1 kg)   BMI 37.38 kg/m  Gen: NAD, alert, cooperative with exam HEENT: NCAT, EOMI, PERRL CV: RRR, good S1/S2, no murmur Resp: CTABL, no wheezes, non-labored Ext: No edema, warm Neuro: Alert and oriented, No gross deficits  DRE Extremely tender on exam with digital rectal exam. No abnormalities seen at the anal verge or palpated, however exam was limited due to pain. Repeat attempt taken  Assessment and plan:  # Rectal pain Given rectal pain, chills, subjective fever, and history of rectal abscess with presumed recurrence last month, I have recommended following through with his surgery appointment this morning. I am concerned for recurrent deep abscess. He has had also rectal bleeding which could simply indicate a rectal fissure that cannot be observed due to my limited exam  Repeat course of augmentin sent.   Meds ordered this encounter  Medications  . amoxicillin-clavulanate (AUGMENTIN) 875-125 MG tablet    Sig: Take 1 tablet by mouth 2 (two) times daily.    Dispense:  20 tablet    Refill:  0    Murtis Sink, MD [place] [facility] 11/29/2016, 10:09 AM  PT called back after surgery cponsul;t. He was sent to ED. HE is very concrned because  he is still paying his bill from previous ED trip.   I will proceed with CT to eval for deep abscess/   If denied or not timely will send to ED.   Murtis Sink, MD [facility] 11/29/2016, 12:51 PM

## 2016-11-29 NOTE — Addendum Note (Signed)
Addended by: Elenora Gamma on: 11/29/2016 12:51 PM   Modules accepted: Orders

## 2016-12-02 ENCOUNTER — Encounter: Payer: Self-pay | Admitting: Nurse Practitioner

## 2016-12-02 ENCOUNTER — Ambulatory Visit (INDEPENDENT_AMBULATORY_CARE_PROVIDER_SITE_OTHER): Payer: 59 | Admitting: Nurse Practitioner

## 2016-12-02 ENCOUNTER — Telehealth: Payer: Self-pay | Admitting: Internal Medicine

## 2016-12-02 VITALS — BP 120/84 | HR 80 | Ht 65.0 in | Wt 229.4 lb

## 2016-12-02 DIAGNOSIS — K625 Hemorrhage of anus and rectum: Secondary | ICD-10-CM | POA: Diagnosis not present

## 2016-12-02 DIAGNOSIS — K602 Anal fissure, unspecified: Secondary | ICD-10-CM

## 2016-12-02 DIAGNOSIS — K6289 Other specified diseases of anus and rectum: Secondary | ICD-10-CM | POA: Diagnosis not present

## 2016-12-02 MED ORDER — DILTIAZEM GEL 2 %: 1.0000 "application " | CUTANEOUS | 1 refills | Status: DC

## 2016-12-02 MED ORDER — LIDOCAINE HCL 2 % EX GEL: 1.0000 "application " | CUTANEOUS | 1 refills | Status: DC | PRN

## 2016-12-02 MED ORDER — NA SULFATE-K SULFATE-MG SULF 17.5-3.13-1.6 GM/177ML PO SOLN: ORAL | 0 refills | Status: DC

## 2016-12-02 NOTE — Telephone Encounter (Signed)
Pt having issues with rectal pain and bleeding. Was seen by [facility], ?fissure. [facility] requested pt be seen and scheduled for colonoscopy asap. Pt scheduled to see Willette Cluster NP today at 3pm. Pt aware of appt.

## 2016-12-02 NOTE — Progress Notes (Signed)
     Chief Complaint:   HPI: Patient is a 31 year old male with a history of constipation.  He was seen in Oct 2017, diagnosed with anal fissure. Treated with Diltiazem but apparently didn't get resolution of pain and bleeding. He came back in May of this year with perianal pain and fevers.  Found to have perianal abscess, treated with Cipro and Flagyl, sitz baths and referral to general surgery. Patient says that Dr. Derrell Lolling with [facility] performed an I&D.   Mid September patient developed recurrent perianal pain, bleeding and fevers. PCP prescribed Augmentin, sx resolved. Last Thursday he began to have recurrent pain / fevers. Saw PCP but nothing remarkable on exam. On Friday patient saw [facility] and told "possible fissure", recommended ED for a CT scan. Patient didn't want to go to ED. PCP arranged for CT scan to be [facility] CT scan of abd/ pelvis with contrast 3 days ago was negative for abscess. No rectal abnormalities. Patient's stools are soft he just has difficult time passing them. No abdominal pain, just rectal pain, especially with defecation. No urinary sx.     Past Medical History:  Diagnosis Date  . Constipation     Patient's surgical history, family medical history, social history, medications and allergies were all reviewed in Epic    Physical Exam: BP 120/84 (BP Location: Left Arm, Patient Position: Sitting, Cuff Size: Normal)   Pulse 80   Ht  (1.651 m) Comment: height measured without shoes  Wt 229 lb 6 oz (104 kg)   BMI 38.17 kg/m   GENERAL:  Well developed white male in NAD PSYCH: :Pleasant, cooperative, normal affect PULM: Normal respiratory effort SKIN:  turgor, no lesions seen NEURO: Alert and oriented x 3, no focal neurologic deficits RECTAL: No external lesions. No fluctuance or other signs of abscess. I applied lidocaine cream to my finger to gently inserted it into anal canal. Marked tenderness at anterior midline inside anus.  After a few minutes I was able to perform a DRE. Extremely tight anal sphincter (internal and external) Thick linear ridge of tissue anterior midline just inside anal canal. I gently inserted anoscope but unable to insert fully because of pain and ? Spasm. Immediately saw some oozing from anterior midline wall where there appeared to be a fissure. I could not insert scope all the way so length of fissure not determined.    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  He had associated perianal abscess in May treated with antibiotics and what sounds like I+D by [facility]. Now with recurrent perianal pain and bleeding. Appears to have an anterior midline fissure inside of the anus. No signs of abscess at this point -diltiazem gel 5 x daily for 6-8 weeks.  -lidocaine get 4-5 times daily as needed for pain. Showed how to administer to reach intended area.  -keep stools soft with metamucil as already doing. Do not strain.   -he needs a colonoscopy. Anterior fissure is atypical area for fissures in setting of constipation. Area of concern can be better examined under anesthesia. Offered colonoscopy on 11/8 but patient cannot on that day. He will have it done in December. Will call us in the interim if symptoms worsening.     Willette Cluster , NP 12/02/2016, 3:28 PM

## 2016-12-02 NOTE — Patient Instructions (Signed)
If you are age 31 or older, your body mass index should be between 23-30. Your Body mass index is 38.17 kg/m. If this is out of the aforementioned range listed, please consider follow up with your Primary Care Provider.  If you are age 79 or younger, your body mass index should be between 19-25. Your Body mass index is 38.17 kg/m. If this is out of the aformentioned range listed, please consider follow up with your Primary Care Provider.   You have been scheduled for a colonoscopy. Please follow written instructions given to you at your visit today.  Please pick up your prep supplies at the pharmacy within the next 1-3 days. If you use inhalers (even only as needed), please bring them with you on the day of your procedure. Your physician has requested that you go to www.startemmi.com and enter the access code given to you at your visit today. This web site gives a general overview about your procedure. However, you should still follow specific instructions given to you by our office regarding your preparation for the procedure.  We have sent the following medications to [facility] for you to pick up at your convenience: Diltiazem gel Lidocaine Gel   Thank you for choosing me and [facility].   Willette Cluster, NP

## 2016-12-03 ENCOUNTER — Ambulatory Visit: Payer: Commercial Managed Care - HMO | Admitting: Gastroenterology

## 2016-12-03 NOTE — Progress Notes (Signed)
Case reviewed with GI nurse practitioner. Agree with symptomatic therapies and proceeding to colonoscopy to rule out IBD

## 2016-12-26 ENCOUNTER — Encounter: Payer: 59 | Admitting: Internal Medicine

## 2017-01-16 ENCOUNTER — Encounter: Payer: Self-pay | Admitting: Internal Medicine

## 2017-01-21 ENCOUNTER — Ambulatory Visit (AMBULATORY_SURGERY_CENTER): Payer: 59 | Admitting: Internal Medicine

## 2017-01-21 ENCOUNTER — Encounter: Payer: Self-pay | Admitting: Internal Medicine

## 2017-01-21 ENCOUNTER — Other Ambulatory Visit: Payer: Self-pay

## 2017-01-21 VITALS — BP 114/82 | HR 88 | Temp 98.4°F | Resp 12 | Ht 65.0 in | Wt 229.0 lb

## 2017-01-21 DIAGNOSIS — K625 Hemorrhage of anus and rectum: Secondary | ICD-10-CM | POA: Diagnosis not present

## 2017-01-21 DIAGNOSIS — K602 Anal fissure, unspecified: Secondary | ICD-10-CM

## 2017-01-21 MED ORDER — SODIUM CHLORIDE 0.9 % IV SOLN: 500.0000 mL | Freq: Once | INTRAVENOUS | Status: DC

## 2017-01-21 NOTE — Progress Notes (Signed)
A/ox3 pleased with MAC, report to Sarah RN 

## 2017-01-21 NOTE — Patient Instructions (Signed)
YOU HAD AN ENDOSCOPIC PROCEDURE TODAY AT THE [facility]:   Refer to the procedure report that was given to you for any specific questions about what was found during the examination.  If the procedure report does not answer your questions, please call your gastroenterologist to clarify.  If you requested that your care partner not be given the details of your procedure findings, then the procedure report has been included in a sealed envelope for you to review at your convenience later.  YOU SHOULD EXPECT: Some feelings of bloating in the abdomen. Passage of more gas than usual.  Walking can help get rid of the air that was put into your GI tract during the procedure and reduce the bloating. If you had a lower endoscopy (such as a colonoscopy or flexible sigmoidoscopy) you may notice spotting of blood in your stool or on the toilet paper. If you underwent a bowel prep for your procedure, you may not have a normal bowel movement for a few days.  Please Note:  You might notice some irritation and congestion in your nose or some drainage.  This is from the oxygen used during your procedure.  There is no need for concern and it should clear up in a day or so.  SYMPTOMS TO REPORT IMMEDIATELY:   Following lower endoscopy (colonoscopy or flexible sigmoidoscopy):  Excessive amounts of blood in the stool  Significant tenderness or worsening of abdominal pains  Swelling of the abdomen that is new, acute  Fever of 100F or higher   For urgent or emergent issues, a gastroenterologist can be reached at any hour by calling [phone].   DIET:  We do recommend a small meal at first, but then you may proceed to your regular diet.  Drink plenty of fluids but you should avoid alcoholic beverages for 24 hours.  MEDICATIONS: Use Diltiazem ointment 5 times daily until fissure healed. Use Metamucil 2 tablespoons daily for bowel consistency to be favorable.  FOLLOW UP:  Repeat colonoscopy at age 31  for screening purposes. Contact Dr. Lamar SprinklesPerry's office for questions/problems . Otherwise, follow-up as needed.  ACTIVITY:  You should plan to take it easy for the rest of today and you should NOT DRIVE or use heavy machinery until tomorrow (because of the sedation medicines used during the test).    FOLLOW UP: Our staff will call the number listed on your records the next business day following your procedure to check on you and address any questions or concerns that you may have regarding the information given to you following your procedure. If we do not reach you, we will leave a message.  However, if you are feeling well and you are not experiencing any problems, there is no need to return our call.  We will assume that you have returned to your regular daily activities without incident.  If any biopsies were taken you will be contacted by phone or by letter within the next 1-3 weeks.  Please call us at [phone] if you have not heard about the biopsies in 3 weeks.   Thank you for allowing us to provide for your healthcare needs today.  SIGNATURES/CONFIDENTIALITY: You and/or your care partner have signed paperwork which will be entered into your electronic medical record.  These signatures attest to the fact that that the information above on your After Visit Summary has been reviewed and is understood.  Full responsibility of the confidentiality of this discharge information lies with you and/or your  care-partner.

## 2017-01-21 NOTE — Op Note (Signed)
[facility] Patient Name: Luis Simpson Procedure Date: 01/21/2017 1:51 PM [name]: [id number] Endoscopist: Wilhemina BonitoJohn N. Marina GoodellPerry , MD Age: [age] Referring MD:  Date of Birth: 1985-06-13 Gender: Male Account #: [id number] Procedure:                Colonoscopy Indications:              Rectal bleeding. Also a history of both perirectal                            abscess requiring surgical drainage and anal                            fissure. Rule out Crohn's Medicines:                Monitored Anesthesia Care Procedure:                Pre-Anesthesia Assessment:                           - Prior to the procedure, a History and Physical                            was performed, and patient medications and                            allergies were reviewed. The patient's tolerance of                            previous anesthesia was also reviewed. The risks                            and benefits of the procedure and the sedation                            options and risks were discussed with the patient.                            All questions were answered, and informed consent                            was obtained. Prior Anticoagulants: The patient has                            taken no previous anticoagulant or antiplatelet                            agents. ASA Grade Assessment: I - A normal, healthy                            patient. After reviewing the risks and benefits,                            the patient was deemed in satisfactory condition to  undergo the procedure.                           After obtaining informed consent, the colonoscope                            was passed under direct vision. Throughout the                            procedure, the patient's blood pressure, pulse, and                            oxygen saturations were monitored continuously. The                            Colonoscope was introduced through the anus and                          advanced to the the cecum, identified by                            appendiceal orifice and ileocecal valve. The                            terminal ileum, ileocecal valve, appendiceal                            orifice, and rectum were photographed. The quality                            of the bowel preparation was excellent. The                            colonoscopy was performed without difficulty. The                            patient tolerated the procedure well. The bowel                            preparation used was SUPREP. Scope In: 2:06:57 PM Scope Out: 2:16:36 PM Scope Withdrawal Time: 0 hours 7 minutes 44 seconds  Total Procedure Duration: 0 hours 9 minutes 39 seconds  Findings:                 RECTAL EXAM: Small anterior fissure and                            hypertrophic anal papilla                           - Normal terminal ileum                           - The entire examined colon appeared normal on  direct and retroflexion views. No evidence of                            Crohn's. Complications:            No immediate complications. Estimated blood loss:                            None. Estimated Blood Loss:     Estimated blood loss: none. Impression:               - Normal terminal ileum                           - The entire examined colon is normal on direct and                            retroflexion views. No evidence of Crohn's disease.                           - Anterior anal fissure and hypertrophic anal                            papilla. Recommendation:           - Repeat colonoscopy at age 19 for screening                            purposes.                           - Metamucil 2 tablespoons daily bowel consistency                            favorable.                           - Use diltiazem ointment 5 times daily until                            fissure healed.                           - Contact  the office for questions or problems.                            Otherwise follow-up as needed. Wilhemina Bonito. Marina Goodell, MD 01/21/2017 2:23:15 PM This report has been signed electronically.

## 2017-01-22 ENCOUNTER — Telehealth: Payer: Self-pay | Admitting: *Deleted

## 2017-01-22 NOTE — Telephone Encounter (Signed)
  Follow up Call-  Call back number 01/21/2017  Post procedure Call Back phone  # [phone]  Permission to leave phone message Yes  Some recent data might be hidden     Patient questions:  Do you have a fever, pain , or abdominal swelling? No. Pain Score  0 *  Have you tolerated food without any problems? Yes.    Have you been able to return to your normal activities? Yes.    Do you have any questions about your discharge instructions: Diet   No. Medications  No. Follow up visit  No.  Do you have questions or concerns about your Care? No.  Actions: * If pain score is 4 or above: No action needed, pain <4.

## 2017-03-10 ENCOUNTER — Telehealth: Payer: Self-pay | Admitting: *Deleted

## 2017-03-10 MED ORDER — CLOTRIMAZOLE 10 MG MT TROC: 10.0000 mg | Freq: Every day | OROMUCOSAL | 0 refills | Status: DC

## 2017-03-10 NOTE — Telephone Encounter (Signed)
I have sent Clotrimazole troche for symps of thrush.   Luis SinkSam Banyan Goodchild, MD [facility] 03/10/2017, 11:27 AM

## 2017-03-10 NOTE — Telephone Encounter (Signed)
pt aware 

## 2017-08-16 IMAGING — DX DG ABDOMEN 1V
2 series · 2 of 2 positions shown · non-contrast
Comparison: None.

CLINICAL DATA: Constipation

EXAM:
ABDOMEN - 1 VIEW

[abdomen kub (1 of 2)]
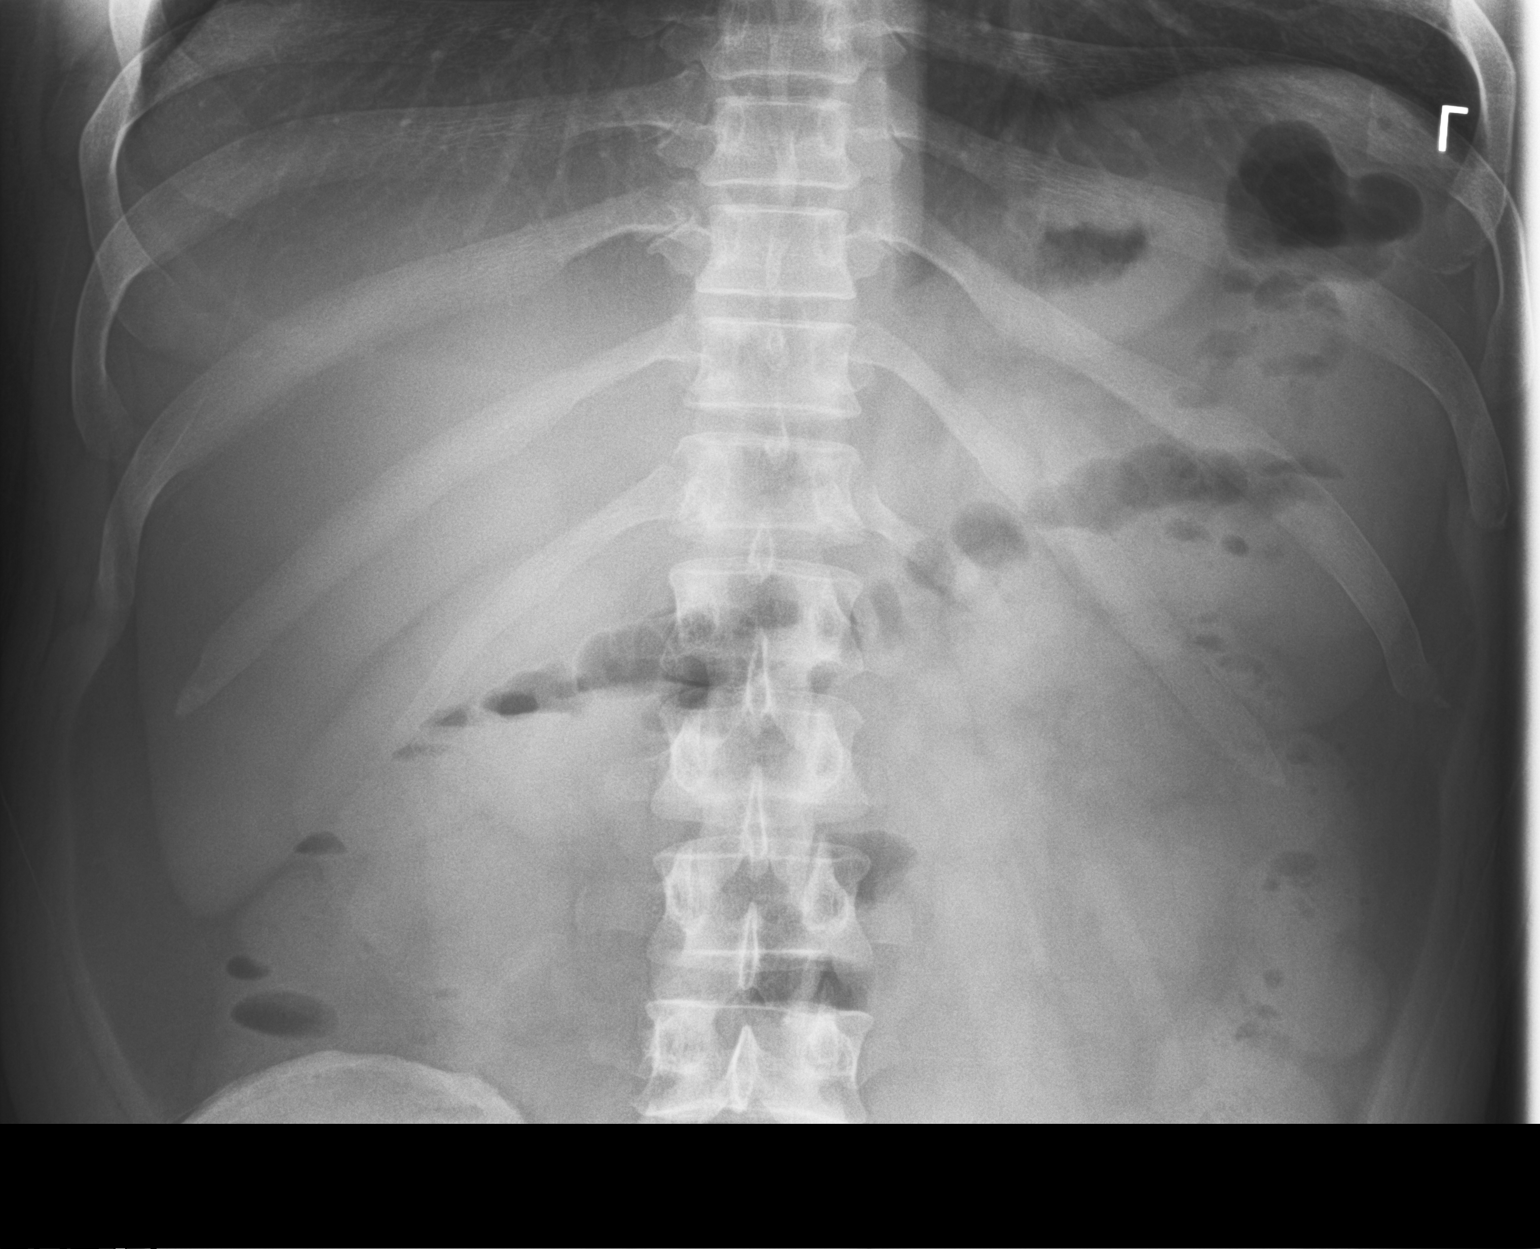

[abdomen kub (2 of 2)]
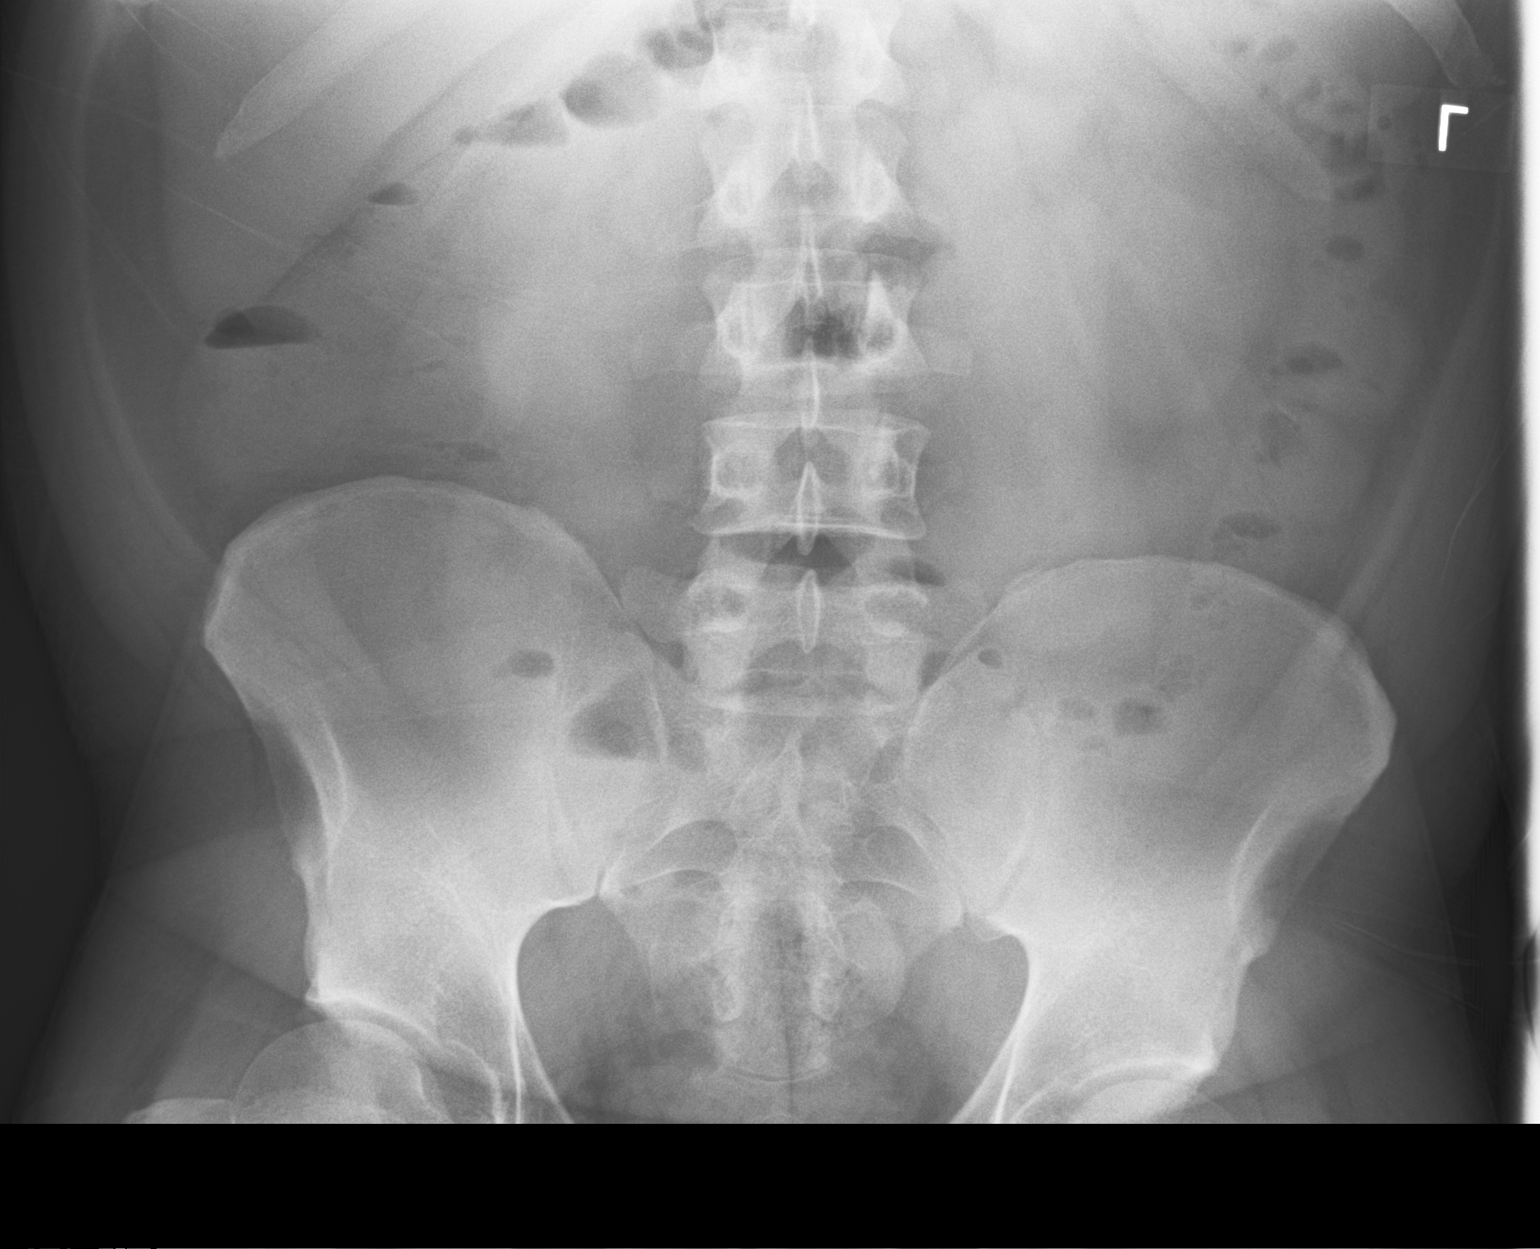

[2 of 2 positions shown; findings below may reference images not displayed]

FINDINGS: No significant increase in stool burden. There is a non obstructive
bowel gas pattern. No supine evidence of free air. No organomegaly
or suspicious calcification.No acute bony abnormality.
IMPRESSION: Negative.

## 2018-09-25 ENCOUNTER — Ambulatory Visit (INDEPENDENT_AMBULATORY_CARE_PROVIDER_SITE_OTHER): Payer: BLUE CROSS/BLUE SHIELD | Admitting: Nurse Practitioner

## 2018-09-25 ENCOUNTER — Encounter: Payer: Self-pay | Admitting: Nurse Practitioner

## 2018-09-25 ENCOUNTER — Other Ambulatory Visit: Payer: Self-pay

## 2018-09-25 DIAGNOSIS — H60331 Swimmer's ear, right ear: Secondary | ICD-10-CM

## 2018-09-25 MED ORDER — OFLOXACIN 0.3 % OT SOLN: 5.0000 [drp] | Freq: Every day | OTIC | 0 refills | Status: DC

## 2018-09-25 MED ORDER — AMOXICILLIN 875 MG PO TABS: 875.0000 mg | ORAL_TABLET | Freq: Two times a day (BID) | ORAL | 0 refills | Status: DC

## 2018-09-25 NOTE — Progress Notes (Signed)
Virtual Visit via telephone Note Due to COVID-19 pandemic this visit was conducted virtually. This visit type was conducted due to national recommendations for restrictions regarding the COVID-19 Pandemic (e.g. social distancing, sheltering in place) in an effort to limit this patient's exposure and mitigate transmission in our community. All issues noted in this document were discussed and addressed.  A physical exam was not performed with this format.  I connected with Luis Simpson on 09/25/18 at 10:30 by telephone and verified that I am speaking with the correct person using two identifiers. Luis Simpson is currently located at home and no one is currently with her during visit. The provider, Mary-Margaret Hassell Done, FNP is located in their office at time of visit.  I discussed the limitations, risks, security and privacy concerns of performing an evaluation and management service by telephone and the availability of in person appointments. I also discussed with the patient that there may be a patient responsible charge related to this service. The patient expressed understanding and agreed to proceed.   History and Present Illness:   Chief Complaint: Otalgia   HPI Patient calls in c/o  Right ear pain. He went swimming in [place] Sunday and started hurting Monday. Feels like wwter is in it and it is clogged. Painful when touching ear and has radiated to his jaw.   Review of Systems  Constitutional: Negative for diaphoresis and weight loss.  HENT: Positive for ear pain.   Eyes: Negative for blurred vision, double vision and pain.  Respiratory: Negative for shortness of breath.   Cardiovascular: Negative for chest pain, palpitations, orthopnea and leg swelling.  Gastrointestinal: Negative for abdominal pain.  Skin: Negative for rash.  Neurological: Negative for dizziness, sensory change, loss of consciousness, weakness and headaches.  Endo/Heme/Allergies: Negative for  polydipsia. Does not bruise/bleed easily.  Psychiatric/Behavioral: Negative for memory loss. The patient does not have insomnia.   All other systems reviewed and are negative.    Observations/Objective: Alert and oriented Answers all questions appropriately Mild distress  Assessment and Plan: Luis Simpson in today with chief complaint of Otalgia   1. Acute swimmer's ear of right side  Follow Up Instructions: Meds ordered this encounter  Medications   ofloxacin (FLOXIN OTIC) 0.3 % OTIC solution    Sig: Place 5 drops into both ears daily.    Dispense:  10 mL    Refill:  0    Order Specific Question:   Supervising Provider    Answer:   Caryl Pina A [[id number]]   amoxicillin (AMOXIL) 875 MG tablet    Sig: Take 1 tablet (875 mg total) by mouth 2 (two) times daily. 1 po BID    Dispense:  20 tablet    Refill:  0    Order Specific Question:   Supervising Provider    Answer:   Caryl Pina A [[id number]]   Avoid getting water in ear Motrin or tylenol OTC as needed for pain Follow prn    I discussed the assessment and treatment plan with the patient. The patient was provided an opportunity to ask questions and all were answered. The patient agreed with the plan and demonstrated an understanding of the instructions.   The patient was advised to call back or seek an in-person evaluation if the symptoms worsen or if the condition fails to improve as anticipated.  The above assessment and management plan was discussed with the patient. The patient verbalized understanding of and has agreed to the management  plan. Patient is aware to call the clinic if symptoms persist or worsen. Patient is aware when to return to the clinic for a follow-up visit. Patient educated on when it is appropriate to go to the emergency department.   Time call ended:  10:40  I provided 10 minutes of non-face-to-face time during this encounter.    Mary-Margaret Daphine DeutscherMartin, FNP

## 2018-11-02 IMAGING — CT CT ABD-PELV W/ CM
2 of 4 series · 17 of 46 positions shown, 19 images · IV contrast (Isovue)
Comparison: CT scan of July 02, 2016.

CLINICAL DATA: Rectal pain.

EXAM:
CT ABDOMEN AND PELVIS WITH CONTRAST
TECHNIQUE: Multidetector CT imaging of the abdomen and pelvis was performed
using the standard protocol following bolus administration of
intravenous contrast.
CONTRAST:  100 mL of Xsovue-BMM intravenously.

[Series 2: axial st · axial · 0.84mm/px · z∈[+804,+1329]mm · 14 of 115 slices shown, 16 images]
[im 5/115  soft-tissue]
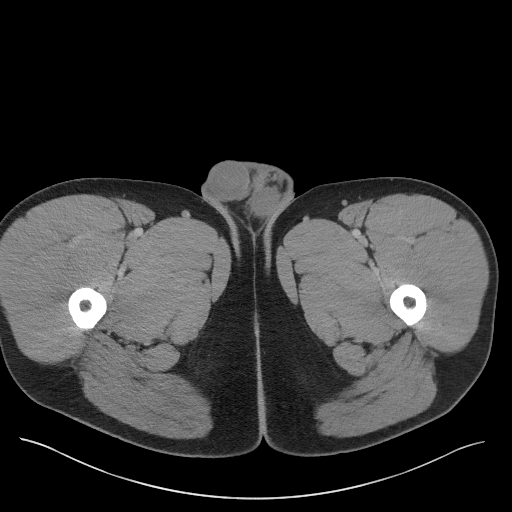
[im 5/115  bone]
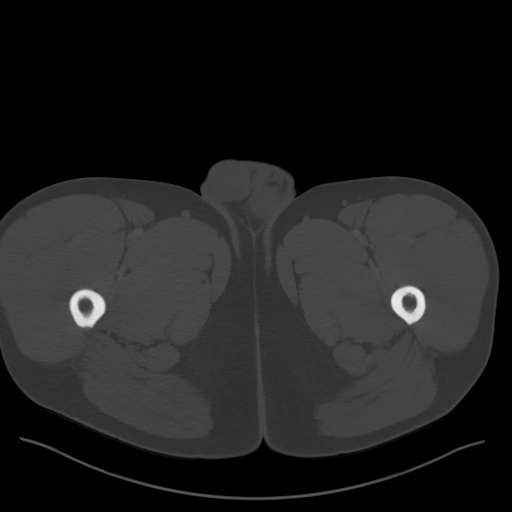
[im 14/115  soft-tissue]
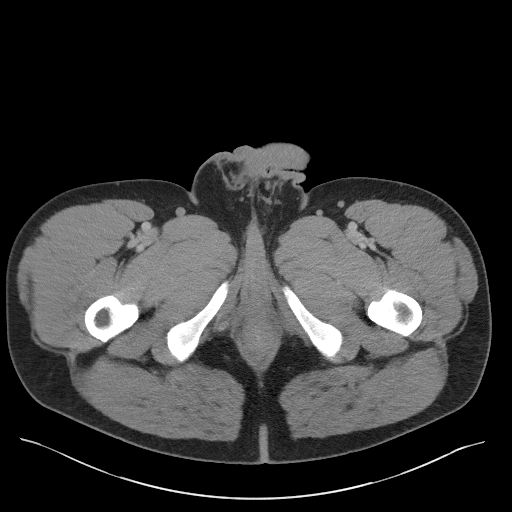
[im 23/115  soft-tissue]
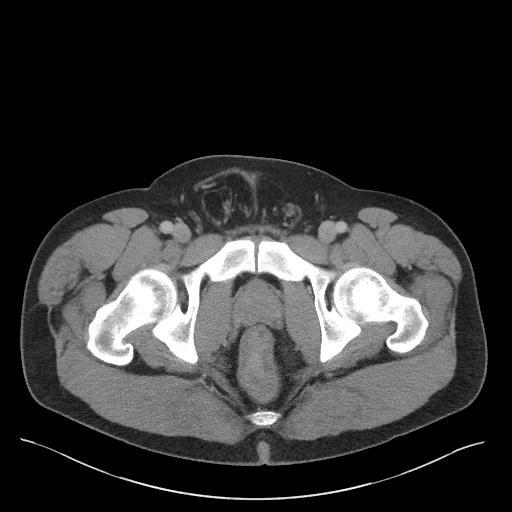
[im 32/115  soft-tissue]
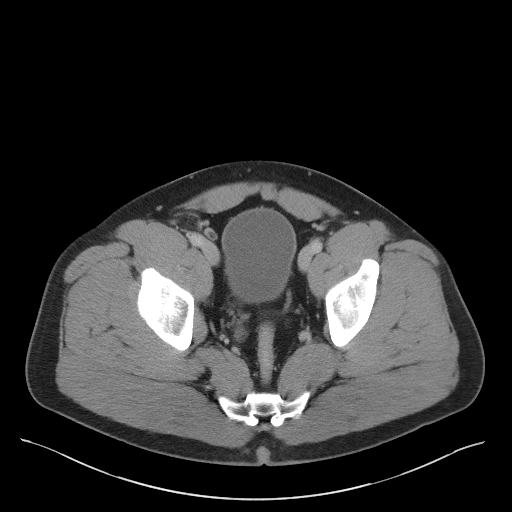
[im 37/115  soft-tissue]
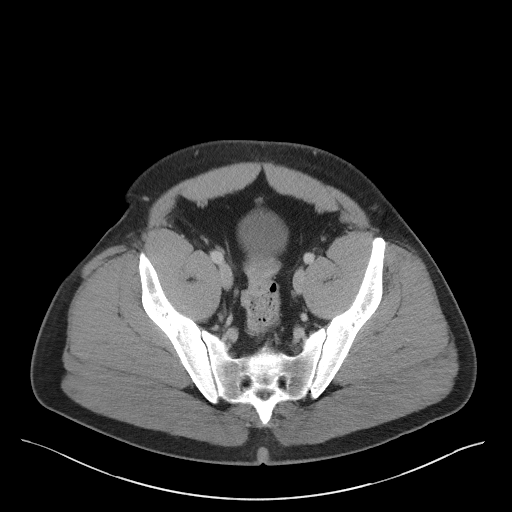
[im 46/115  soft-tissue]
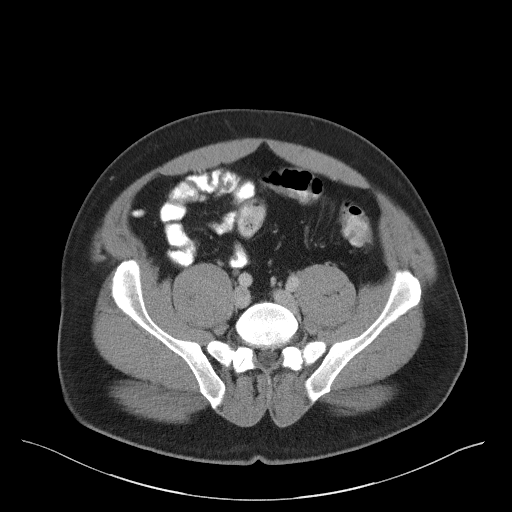
[im 55/115  soft-tissue]
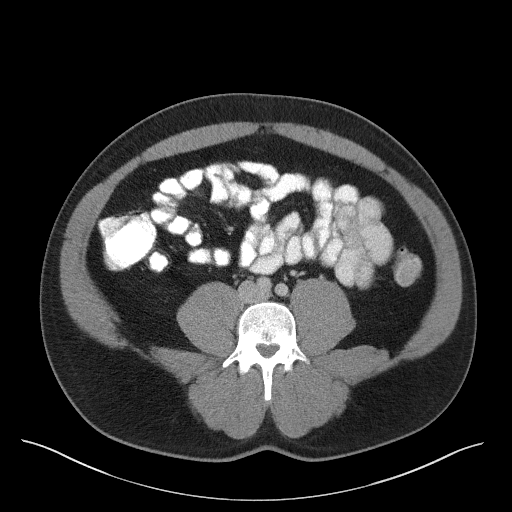
[im 60/115  soft-tissue]
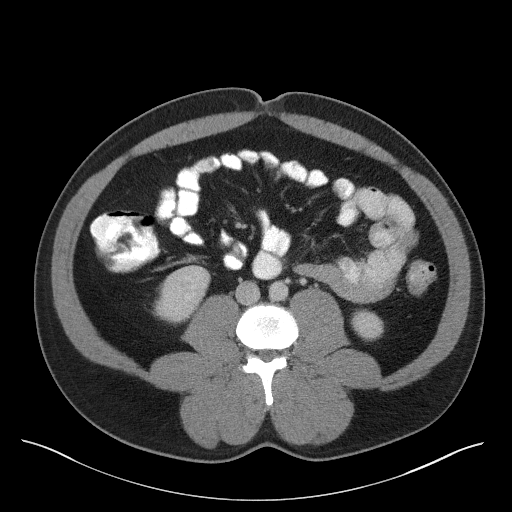
[im 69/115  soft-tissue]
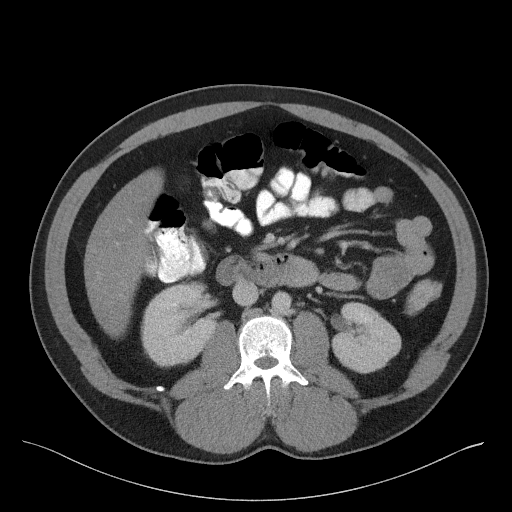
[im 69/115  bone]
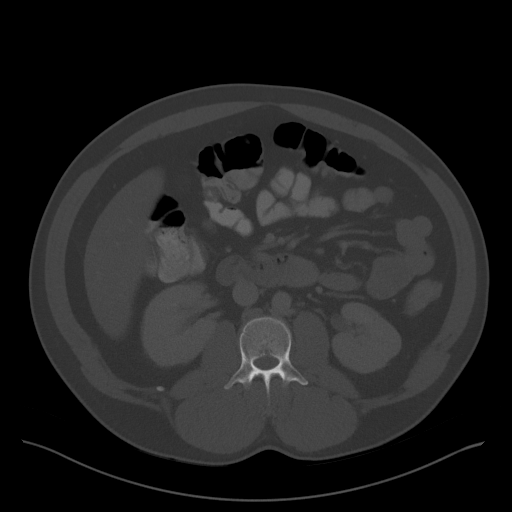
[im 78/115  soft-tissue]
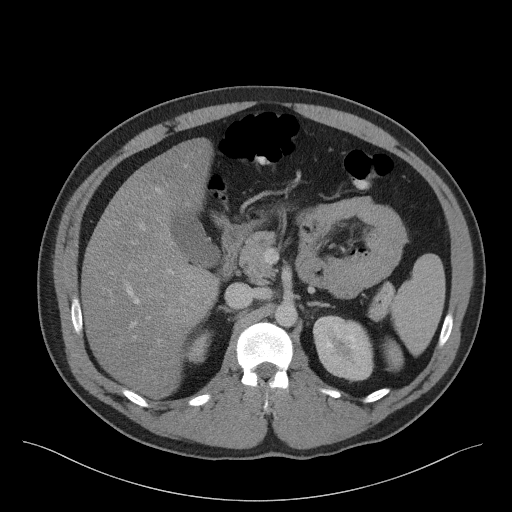
[im 87/115  soft-tissue]
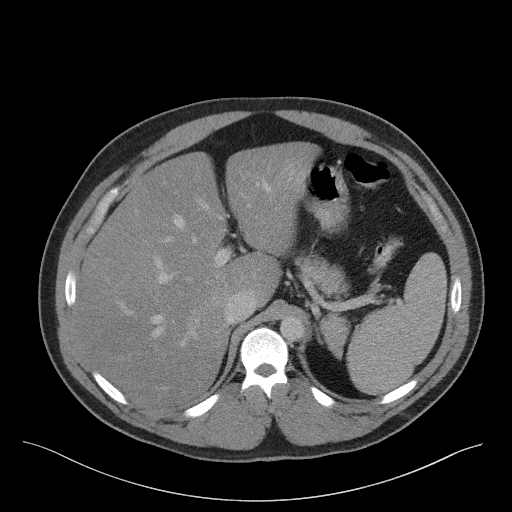
[im 92/115  soft-tissue]
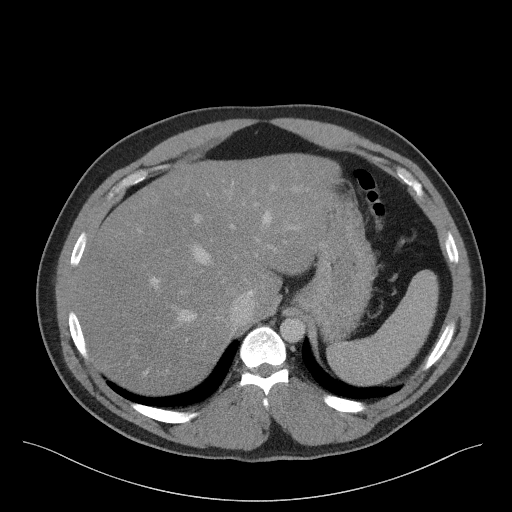
[im 101/115  soft-tissue]
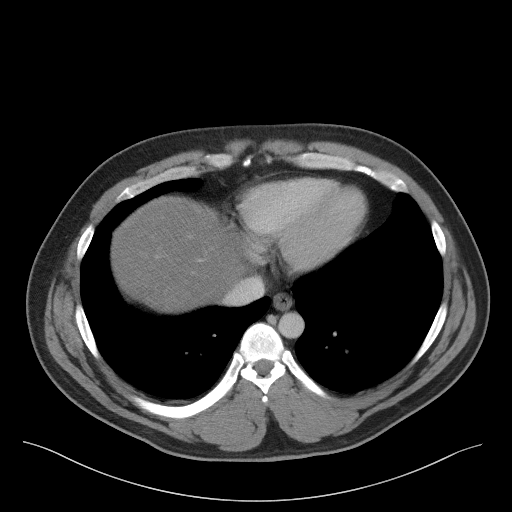
[im 110/115  soft-tissue]
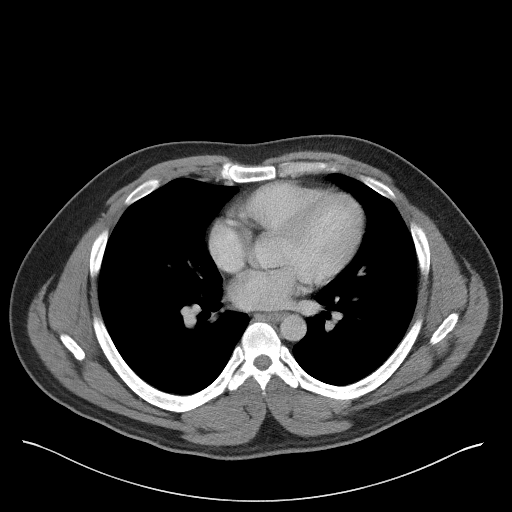

[Series 5: coronal st · coronal · 1.00mm/px · 3 of 102 slices shown]
[im 34/102  soft-tissue]
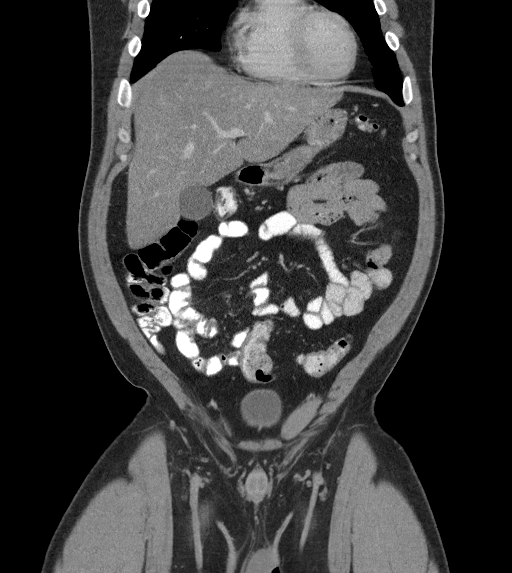
[im 45/102  soft-tissue]
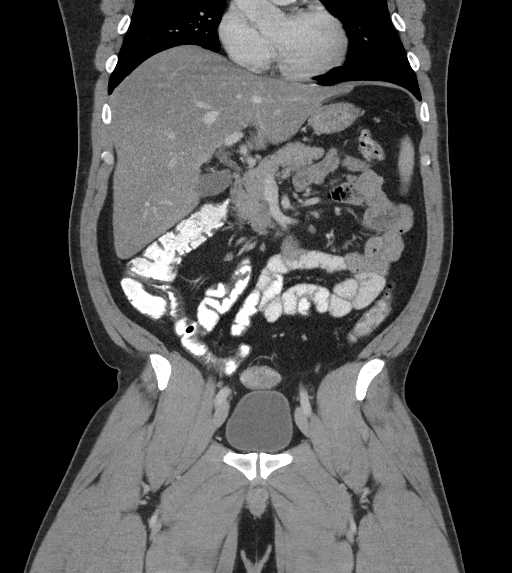
[im 57/102  soft-tissue]
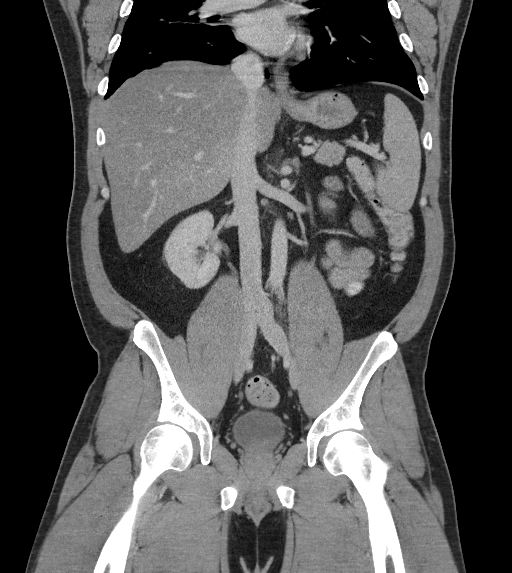

[17 of 46 positions shown; findings below may reference images not displayed]

FINDINGS: Lower chest: No acute abnormality.

Hepatobiliary: No gallstones are noted. Fatty infiltration of the
liver is noted.

Pancreas: Unremarkable. No pancreatic ductal dilatation or
surrounding inflammatory changes.

Spleen: Normal in size without focal abnormality.

Adrenals/Urinary Tract: Adrenal glands are unremarkable. Kidneys are
normal, without renal calculi, focal lesion, or hydronephrosis.
Bladder is unremarkable.

Stomach/Bowel: Stomach is within normal limits. Appendix appears
normal. No evidence of bowel wall thickening, distention, or
inflammatory changes.

Vascular/Lymphatic: No significant vascular findings are present. No
enlarged abdominal or pelvic lymph nodes.

Reproductive: Prostate is unremarkable.

Other: No abdominal wall hernia or abnormality. No abdominopelvic
ascites.

Musculoskeletal: No acute or significant osseous findings.
IMPRESSION: Fatty infiltration of the liver. No other abnormality seen in the
abdomen or pelvis.

## 2018-11-17 ENCOUNTER — Telehealth: Payer: Self-pay | Admitting: Nurse Practitioner

## 2018-11-17 NOTE — Telephone Encounter (Signed)
Patient aware.

## 2018-11-17 NOTE — Telephone Encounter (Signed)
Aware to be tested.  Patient complains of fever, fatigue, body aches.  Patient would like a work note if possible.

## 2018-11-17 NOTE — Telephone Encounter (Signed)
If she has been around her son and he comes back positive sh ejust should assume that she has it and quarantine for 14 days. No need ot be tested unless she becomes extremely SOB and needs to go to the ER

## 2019-02-22 ENCOUNTER — Ambulatory Visit (INDEPENDENT_AMBULATORY_CARE_PROVIDER_SITE_OTHER): Payer: BLUE CROSS/BLUE SHIELD

## 2019-02-22 ENCOUNTER — Other Ambulatory Visit: Payer: Self-pay

## 2019-02-22 ENCOUNTER — Ambulatory Visit (INDEPENDENT_AMBULATORY_CARE_PROVIDER_SITE_OTHER): Payer: BLUE CROSS/BLUE SHIELD | Admitting: Family

## 2019-02-22 ENCOUNTER — Encounter: Payer: Self-pay | Admitting: Family

## 2019-02-22 VITALS — BP 149/99 | HR 97 | Temp 97.5°F | Ht 65.0 in | Wt 244.0 lb

## 2019-02-22 DIAGNOSIS — S8392XA Sprain of unspecified site of left knee, initial encounter: Secondary | ICD-10-CM

## 2019-02-22 DIAGNOSIS — M25562 Pain in left knee: Secondary | ICD-10-CM | POA: Diagnosis not present

## 2019-02-22 MED ORDER — DICLOFENAC SODIUM 75 MG PO TBEC: 75.0000 mg | DELAYED_RELEASE_TABLET | Freq: Two times a day (BID) | ORAL | 0 refills | Status: DC

## 2019-02-22 MED ORDER — TRAMADOL HCL 50 MG PO TABS: 50.0000 mg | ORAL_TABLET | Freq: Two times a day (BID) | ORAL | 0 refills | Status: AC | PRN

## 2019-02-22 NOTE — Patient Instructions (Signed)
Combined Knee Ligament Sprain  A ligament is a tough band of tissue that connects one bone to another bone. There are four ligaments in your knee. Together, they provide stability for your knee joint. A combined knee ligament sprain is an injury that happens when more than one knee ligament is severely stretched or torn. This kind of injury is also called an injury to multiple structures of the knee. What are the causes? This condition may be caused by:  A direct hit (trauma) to the knee.  Overextending the knee.  Twisting the knee. What increases the risk? You are more likely to develop this condition if you participate in certain sports, including:  Contact sports, such as football, rugby, and lacrosse.  Sports that take place on uneven ground, such as soccer and cross country.  Sports that involve quick changes in position, such as basketball, dancing, gymnastics, and skiing. You are also more likely to develop this condition if you:  Have poor strength or flexibility.  Are overweight.  Have overly flexible joints (joint laxity).  Have previously injured your knee or had surgery on your knee. What are the signs or symptoms? Common symptoms of this condition include:  Swelling.  Severe pain with movement.  Pain when the injured area is touched.  Instability.  A popping sound that happens at the time of injury.  Not being able to stand or use the injured knee to support (bear) one's body weight. How is this diagnosed? This condition is diagnosed with a physical exam. You may also have imaging tests, such as:  X-ray.  MRI. How is this treated? Treatment depends on how badly the ligaments are injured and may include:  Ice applied to the affected area.  Medicines for pain.  Placing the knee in a brace or splint to prevent movement and support the joint.  Physical therapy to help strengthen and stabilize the knee joint.  Surgery to reconstruct a torn ligament.  This may be done in severe cases. Follow these instructions at home: If you have a splint or brace:  Wear the splint or brace as told by your health care provider. Remove it only as told by your health care provider.  Loosen the splint or brace if your toes tingle, become numb, or turn cold and blue.  Keep the splint or brace clean.  If the splint or brace is not waterproof: ? Do not let it get wet. ? Cover it with a watertight covering when you take a bath or shower. Managing pain, stiffness, and swelling   If directed, put ice on the injured area. ? If you have a removable splint or brace, remove it as told by your health care provider. ? Put ice in a plastic bag. ? Place a towel between your skin and the bag. ? Leave the ice on for 20 minutes, 2-3 times a day.  Move your toes often to reduce stiffness and swelling.  Raise (elevate) the injured area above the level of your heart while you are sitting or lying down. Medicines  Take over-the-counter and prescription medicines only as told by your health care provider.  Ask your health care provider if the medicine prescribed to you: ? Requires you to avoid driving or using heavy machinery. ? Can cause constipation. You may need to take actions to prevent or treat constipation, such as:  Drink enough fluid to keep your urine pale yellow.  Take over-the-counter or prescription medicines.  Eat foods that are high in  fiber, such as beans, whole grains, and fresh fruits and vegetables.  Limit foods that are high in fat and processed sugars, such as fried or sweet foods. Activity  Ask your health care provider when it is safe to drive if you have a splint or brace on your knee.  Return to your normal activities as told by your health care provider. Ask your health care provider what activities are safe for you.  Perform exercises daily as told by your health care provider or physical therapist.  Do not use the injured limb  to support your body weight until your health care provider says that you can. Use crutches as told by your health care provider. General instructions  Do not take baths, swim, or use a hot tub until your health care provider approves. Ask your health care provider if you may take showers. You may only be allowed to take sponge baths.  Do not use any products that contain nicotine or tobacco, such as cigarettes, e-cigarettes, and chewing tobacco. These can delay healing. If you need help quitting, ask your health care provider.  Keep all follow-up visits as told by your health care provider. This is important. Contact a health care provider if:  Your symptoms do not improve.  Your symptoms get worse.  You develop tingling or numbness in the area of your injury. Get help right away if:  You develop severe numbness or tingling in your leg or foot.  Your foot turns blue, white, or gray, and it feels cold. Summary  A combined knee ligament sprain is an injury that happens when more than one knee ligament is severely stretched or torn.  Follow instructions for the care of your knee, including rest and activity, as told by your health care provider.  Do not use the injured limb to support your body weight until your health care provider says that you can. Use crutches as told by your health care provider.  Contact a health care provider if your symptoms do not improve or get worse.  Keep all follow-up visits as told by your health care provider. This is important. This information is not intended to replace advice given to you by your health care provider. Make sure you discuss any questions you have with your health care provider. Document Revised: 05/27/2018 Document Reviewed: 09/24/2017 Elsevier Patient Education  2020 [employer].

## 2019-02-22 NOTE — Progress Notes (Signed)
Subjective:    Patient ID: Luis Simpson, male    DOB: [birth year], 34 y.o.   MRN: [id number]  Chief Complaint  Patient presents with  . fell, injuring left knee and causing pain   Pt presents to the office today with left knee pain that started Saturday after sliding down a wooden ramp that was wet. He reports he did not fall, but did hear a "pop". He states the pain is worsening and is describing it a 5 out 10 constant pain It is worse if he has to bend to get in his car and walk up or down stairs.  Knee Pain  The incident occurred 2 days ago. The pain is present in the left knee. The pain is at a severity of 5/10. The pain is moderate. Associated symptoms include a loss of motion. Pertinent negatives include no loss of sensation, muscle weakness, numbness or tingling. He reports no foreign bodies present. The symptoms are aggravated by movement. He has tried NSAIDs, acetaminophen and ice for the symptoms. The treatment provided mild relief.      Review of Systems  Neurological: Negative for tingling and numbness.  All other systems reviewed and are negative.      Objective:   Physical Exam Vitals reviewed.  Constitutional:      General: He is not in acute distress.    Appearance: He is well-developed.  HENT:     Head: Normocephalic.     Right Ear: External ear normal.     Left Ear: External ear normal.  Eyes:     General:        Right eye: No discharge.        Left eye: No discharge.     Pupils: Pupils are equal, round, and reactive to light.  Neck:     Thyroid: No thyromegaly.  Cardiovascular:     Rate and Rhythm: Normal rate and regular rhythm.     Heart sounds: Normal heart sounds. No murmur.  Pulmonary:     Effort: Pulmonary effort is normal. No respiratory distress.     Breath sounds: Normal breath sounds. No wheezing.  Abdominal:     General: Bowel sounds are normal. There is no distension.     Palpations: Abdomen is soft.     Tenderness: There is no  abdominal tenderness.  Musculoskeletal:        General: Swelling and tenderness present.     Cervical back: Normal range of motion and neck supple.       Legs:     Comments: Unable to flex knee, pain with flexion and rotation. Pain with palpation of upper knee  Skin:    General: Skin is warm and dry.     Findings: No erythema or rash.  Neurological:     Mental Status: He is alert and oriented to person, place, and time.     Cranial Nerves: No cranial nerve deficit.     Deep Tendon Reflexes: Reflexes are normal and symmetric.  Psychiatric:        Behavior: Behavior normal.        Thought Content: Thought content normal.        Judgment: Judgment normal.         BP (!) 149/99   Pulse 97   Temp (!) 97.5 F (36.4 C) (Temporal)   Ht 5\' 5"  (1.651 m)   Wt 244 lb (110.7 kg)   SpO2 98%   BMI 40.60 kg/m   Assessment &  Plan:  Luis Simpson comes in today with chief complaint of fell, injuring left knee and causing pain   Diagnosis and orders addressed:  1. Acute pain of left knee - DG Knee 1-2 Views Left; Future - MR Knee Left  Wo Contrast; Future - diclofenac (VOLTAREN) 75 MG EC tablet; Take 1 tablet (75 mg total) by mouth 2 (two) times daily.  Dispense: 30 tablet; Refill: 0 - Ambulatory referral to Physical Therapy - Ambulatory referral to Orthopedic Surgery - traMADol (ULTRAM) 50 MG tablet; Take 1-2 tablets (50-100 mg total) by mouth every 12 (twelve) hours as needed for up to 7 days.  Dispense: 28 tablet; Refill: 0  2. Sprain of left knee, unspecified ligament, initial encounter - MR Knee Left  Wo Contrast; Future - diclofenac (VOLTAREN) 75 MG EC tablet; Take 1 tablet (75 mg total) by mouth 2 (two) times daily.  Dispense: 30 tablet; Refill: 0 - Ambulatory referral to Physical Therapy - Ambulatory referral to Orthopedic Surgery - traMADol (ULTRAM) 50 MG tablet; Take 1-2 tablets (50-100 mg total) by mouth every 12 (twelve) hours as needed for up to 7 days.  Dispense: 28  tablet; Refill: 0  Given swelling and unable to flex knee at all worrisome for tear. Will order MRI I have also placed a referral to PT and Ortho Start Diclofenac 75 mg BID, no other NSAID"s Call office if symptoms worsen or do not improve    Jannifer Rodney, FNP

## 2019-02-25 ENCOUNTER — Ambulatory Visit (INDEPENDENT_AMBULATORY_CARE_PROVIDER_SITE_OTHER): Payer: BLUE CROSS/BLUE SHIELD | Admitting: Orthopaedic Surgery

## 2019-02-25 ENCOUNTER — Encounter: Payer: Self-pay | Admitting: Orthopaedic Surgery

## 2019-02-25 ENCOUNTER — Other Ambulatory Visit: Payer: Self-pay

## 2019-02-25 DIAGNOSIS — S8392XA Sprain of unspecified site of left knee, initial encounter: Secondary | ICD-10-CM

## 2019-02-25 NOTE — Progress Notes (Signed)
Office Visit Note   Patient: Luis Simpson           Date of Birth: 1985-02-27           MRN: [id number] Visit Date: 02/25/2019              Requested by: Sharion Balloon, [place],  [place] [address] PCP: Patient, No Pcp Per   Assessment & Plan: Visit Diagnoses:  1. Sprain of left knee, unspecified ligament, initial encounter     Plan: We will check patient after his MRI scan.  He is doing well with the knee sleeve we discussed changing position to avoid some of the window edema.  Follow-Up Instructions: Office follow-up after MRI scan.  Orders:  No orders of the defined types were placed in this encounter.  No orders of the defined types were placed in this encounter.     Procedures: No procedures performed   Clinical Data: No additional findings.   Subjective: Chief Complaint  Patient presents with  . Left Knee - Pain    DOI 02/20/2019    HPI 34 year old male here and injured his left knee on 02/20/2019 when he slipped at the end of a ramp hitting his feet on grass landing firmly and felt a pop in his left knee.  He can still walk the following morning had some pain significant problems bending.  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  Pain is been in the knee joint no pain in the groin no numbness or tingling in his fingers he denies any associated back pain.  He has had tramadol and also diclofenac.  No past history of knee injury.  X-rays left knee 02/22/2019 were negative for acute changes or significant degenerative changes.  Patient has an MRI scheduled 03/03/2019.  Patient is questioning if he can get it done any earlier anyplace else.  Review of Systems positive for overweight, high triglyceride.  GERD.  Positive tobacco use.  Otherwise negative as pertains to HPI.   Objective: Vital Signs: Ht 5\' 7"  (1.702 m)   Wt 240 lb (108.9 kg)   BMI 37.59 kg/m   Physical Exam Constitutional:      Appearance: He is  well-developed.  HENT:     Head: Normocephalic and atraumatic.  Eyes:     Pupils: Pupils are equal, round, and reactive to light.  Neck:     Thyroid: No thyromegaly.     Trachea: No tracheal deviation.  Cardiovascular:     Rate and Rhythm: Normal rate.  Pulmonary:     Effort: Pulmonary effort is normal.     Breath sounds: No wheezing.  Abdominal:     General: Bowel sounds are normal.     Palpations: Abdomen is soft.  Skin:    General: Skin is warm and dry.     Capillary Refill: Capillary refill takes less than 2 seconds.  Neurological:     Mental Status: He is alert and oriented to person, place, and time.  Psychiatric:        Behavior: Behavior normal.        Thought Content: Thought content normal.        Judgment: Judgment normal.     Ortho Exam patient has some tenderness anteriorly over his patella.  He has been wearing a knee sleeve with a hole directly over the patella and there is some window edema subcutaneously without definite prepatellar bursitis.  Some tenderness over the quad  tendon patellar tendon normal patellar tracking mild crepitus with knee extension mild medial lateral joint line tenderness collateral ligaments ACL PCL exam is normal.  Pes bursa hamstrings are normal no Baker's cyst is palpable.  Distal pulses are 2 logroll to the hips no sciatic notch tenderness.  Specialty Comments:  No specialty comments available.  Imaging: No results found.   PMFS History: Patient Active Problem List   Diagnosis Date Noted  . Left knee sprain 02/26/2019  . 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 Hypertriglyceridemia 08/02/2014  . Gastroesophageal reflux disease 04/15/2014  . Obesity (BMI 30.0-34.9) 04/15/2014  . Paresthesia of both hands 04/15/2014  . Tobacco use 04/15/2014   Past Medical History:  Diagnosis Date  . Constipation     Family History  Problem Relation Age of Onset  . Diverticulitis Father   . Colon cancer Neg Hx   . Rectal  cancer Neg Hx   . Stomach cancer Neg Hx     Past Surgical History:  Procedure Laterality Date  . WISDOM TOOTH EXTRACTION     Social History   Occupational History  . Occupation: Corporate investment banker  Tobacco Use  . Smoking status: Former Smoker    Packs/day: 1.00    Years: 15.00    Pack years: 15.00    Types: Cigarettes    Quit date: 01/20/2015    Years since quitting: 4.1  . Smokeless tobacco: Never Used  Substance and Sexual Activity  . Alcohol use: No    Comment: rarely  . Drug use: No  . Sexual activity: Yes    Partners: Female

## 2019-02-26 DIAGNOSIS — S8392XA Sprain of unspecified site of left knee, initial encounter: Secondary | ICD-10-CM | POA: Insufficient documentation

## 2019-03-02 ENCOUNTER — Ambulatory Visit: Payer: BLUE CROSS/BLUE SHIELD | Admitting: Physical Therapy

## 2019-03-03 ENCOUNTER — Other Ambulatory Visit: Payer: Self-pay

## 2019-03-03 ENCOUNTER — Ambulatory Visit (HOSPITAL_COMMUNITY)
Admission: RE | Admit: 2019-03-03 | Discharge: 2019-03-03 | Disposition: A | Discharge status: Home / Self Care | Payer: BLUE CROSS/BLUE SHIELD | Source: Ambulatory Visit | Attending: Family | Admitting: Family

## 2019-03-03 DIAGNOSIS — S8392XA Sprain of unspecified site of left knee, initial encounter: Secondary | ICD-10-CM | POA: Diagnosis present

## 2019-03-03 DIAGNOSIS — M25562 Pain in left knee: Secondary | ICD-10-CM | POA: Insufficient documentation

## 2019-03-04 ENCOUNTER — Encounter: Payer: Self-pay | Admitting: Orthopaedic Surgery

## 2019-03-04 ENCOUNTER — Ambulatory Visit (INDEPENDENT_AMBULATORY_CARE_PROVIDER_SITE_OTHER): Payer: BLUE CROSS/BLUE SHIELD | Admitting: Orthopaedic Surgery

## 2019-03-04 VITALS — Ht 67.0 in | Wt 240.0 lb

## 2019-03-04 DIAGNOSIS — S8392XA Sprain of unspecified site of left knee, initial encounter: Secondary | ICD-10-CM

## 2019-03-04 NOTE — Progress Notes (Signed)
Left message to please call our office. 

## 2019-03-04 NOTE — Progress Notes (Signed)
Office Visit Note   Patient: Luis Simpson           Date of Birth: April 11, 1985           MRN: [id number] Visit Date: 03/04/2019              Requested by: No referring provider defined for this encounter. PCP: Patient, No Pcp Per   Assessment & Plan: Visit Diagnoses:  1. Sprain of left knee, unspecified ligament, initial encounter       With patellar contusion by MRI scan.  Plan: MRI scan is reviewed with patient today I gave him a copy of the report. There is significant edema in the patella consistent with a patellar contusion but he did not have any direct contact on his knee just sudden quadriceps contracture when he slipped to avoid falling. I discussed with him that his symptoms should gradually improve with time and after 6 weeks he should be back to normal. He can use some ice intermittently on his knee if he so desires. Ligamentous exam is normal and menisci were intact. There was no corresponding changes on the femoral condyle to suggest a subluxation event.  Follow-Up Instructions: No follow-ups on file.   Orders:  No orders of the defined types were placed in this encounter.  No orders of the defined types were placed in this encounter.     Procedures: No procedures performed   Clinical Data: No additional findings.   Subjective: Chief Complaint  Patient presents with  . Left Knee - Pain, Follow-up    MRI Review    HPI 34 year old male returns for follow-up of the left knee injury when he slipped on the [place] ramp with his feet at the grass with sharp pain in his knee. He did well until the following 24 to 48 hours from his knee got progressively painful to the point where he is having great difficulty bending problems walking. He still been going to work an MRI scan has been obtained.  Review of Systems 14 point system update unchanged from 02/25/2019   Objective: Vital Signs: Ht 5\' 7"  (1.702 m)   Wt 240 lb (108.9 kg)   BMI 37.59 kg/m   Physical  Exam Constitutional:      Appearance: He is well-developed.  HENT:     Head: Normocephalic and atraumatic.  Eyes:     Pupils: Pupils are equal, round, and reactive to light.  Neck:     Thyroid: No thyromegaly.     Trachea: No tracheal deviation.  Cardiovascular:     Rate and Rhythm: Normal rate.  Pulmonary:     Effort: Pulmonary effort is normal.     Breath sounds: No wheezing.  Abdominal:     General: Bowel sounds are normal.     Palpations: Abdomen is soft.  Skin:    General: Skin is warm and dry.     Capillary Refill: Capillary refill takes less than 2 seconds.  Neurological:     Mental Status: He is alert and oriented to person, place, and time.  Psychiatric:        Behavior: Behavior normal.        Thought Content: Thought content normal.        Judgment: Judgment normal.     Ortho Exam patient has a flexion 130. Is ambulatory with slight left knee limp. Negative logroll to the hips no knee effusion. Normal patellar tracking. No capsular tenderness. Specialty Comments:  No specialty comments available.  Imaging: MR Knee Left  Wo Contrast  Result Date: 03/03/2019 CLINICAL DATA:  The patient felt a pop in his left knee when he slipped 02/20/2019. EXAM: MRI OF THE LEFT KNEE WITHOUT CONTRAST TECHNIQUE: Multiplanar, multisequence MR imaging of the knee was performed. No intravenous contrast was administered. COMPARISON:  None. FINDINGS: MENISCI Medial meniscus:  Intact. Lateral meniscus:  Intact. LIGAMENTS Cruciates:  Intact. Collaterals:  Intact. CARTILAGE Patellofemoral:  Preserved. Medial:  Preserved. Lateral:  Preserved. Joint:  Very small effusion. Popliteal Fossa:  No Baker's cyst. Extensor Mechanism:  Intact. Bones: Mild marrow edema is present throughout the patella. No fracture or focal lesion is identified. Other: None. IMPRESSION: Marrow edema throughout the patella most consistent with contusion. Negative for meniscal or ligament tear. Electronically Signed   By:  Drusilla Kanner M.D.   On: 03/03/2019 15:48     PMFS History: Patient Active Problem List   Diagnosis Date Noted  . Left knee sprain 02/26/2019  . 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 Hypertriglyceridemia 08/02/2014  . Gastroesophageal reflux disease 04/15/2014  . Obesity (BMI 30.0-34.9) 04/15/2014  . Paresthesia of both hands 04/15/2014  . Tobacco use 04/15/2014   Past Medical History:  Diagnosis Date  . Constipation     Family History  Problem Relation Age of Onset  . Diverticulitis Father   . Colon cancer Neg Hx   . Rectal cancer Neg Hx   . Stomach cancer Neg Hx     Past Surgical History:  Procedure Laterality Date  . WISDOM TOOTH EXTRACTION     Social History   Occupational History  . Occupation: Corporate investment banker  Tobacco Use  . Smoking status: Former Smoker    Packs/day: 1.00    Years: 15.00    Pack years: 15.00    Types: Cigarettes    Quit date: 01/20/2015    Years since quitting: 4.1  . Smokeless tobacco: Never Used  Substance and Sexual Activity  . Alcohol use: No    Comment: rarely  . Drug use: No  . Sexual activity: Yes    Partners: Female

## 2019-04-06 ENCOUNTER — Ambulatory Visit (INDEPENDENT_AMBULATORY_CARE_PROVIDER_SITE_OTHER): Payer: BLUE CROSS/BLUE SHIELD | Admitting: Family

## 2019-04-06 ENCOUNTER — Encounter: Payer: Self-pay | Admitting: Family

## 2019-04-06 ENCOUNTER — Other Ambulatory Visit: Payer: Self-pay

## 2019-04-06 DIAGNOSIS — M25562 Pain in left knee: Secondary | ICD-10-CM

## 2019-04-06 DIAGNOSIS — S8392XA Sprain of unspecified site of left knee, initial encounter: Secondary | ICD-10-CM | POA: Diagnosis not present

## 2019-04-06 MED ORDER — DICLOFENAC SODIUM 75 MG PO TBEC: 75.0000 mg | DELAYED_RELEASE_TABLET | Freq: Two times a day (BID) | ORAL | 2 refills | Status: DC

## 2019-04-06 NOTE — Progress Notes (Signed)
   Virtual Visit via telephone Note Due to COVID-19 pandemic this visit was conducted virtually. This visit type was conducted due to national recommendations for restrictions regarding the COVID-19 Pandemic (e.g. social distancing, sheltering in place) in an effort to limit this patient's exposure and mitigate transmission in our community. All issues noted in this document were discussed and addressed.  A physical exam was not performed with this format.  I connected with Luis Simpson on 04/06/19 at 5:03 pm by telephone and verified that I am speaking with the correct person using two identifiers. Luis Simpson is currently located at work and no one  is currently with him during visit. The provider, Jannifer Rodney, FNP is located in their office at time of visit.  I discussed the limitations, risks, security and privacy concerns of performing an evaluation and management service by telephone and the availability of in person appointments. I also discussed with the patient that there may be a patient responsible charge related to this service. The patient expressed understanding and agreed to proceed.   History and Present Illness:  Knee Pain  The incident occurred more than 1 week ago. The injury mechanism was a twisting injury. The pain is present in the left knee. The quality of the pain is described as aching. The pain is at a severity of 3/10. The pain is moderate. The pain has been intermittent since onset. Pertinent negatives include no numbness or tingling. He reports no foreign bodies present. The symptoms are aggravated by movement and weight bearing. He has tried NSAIDs and ice for the symptoms. The treatment provided mild relief.      Review of Systems  Neurological: Negative for tingling and numbness.     Observations/Objective: No SOB or distress noted   Assessment and Plan: 1. Acute pain of left knee - diclofenac (VOLTAREN) 75 MG EC tablet; Take 1 tablet (75 mg  total) by mouth 2 (two) times daily.  Dispense: 60 tablet; Refill: 2  2. Sprain of left knee, unspecified ligament, initial encounter - diclofenac (VOLTAREN) 75 MG EC tablet; Take 1 tablet (75 mg total) by mouth 2 (two) times daily.  Dispense: 60 tablet; Refill: 2   Rest Ice No other NSAID's while taking diclofenac  ROM exercises Call if symptoms worsen or do not improve   I discussed the assessment and treatment plan with the patient. The patient was provided an opportunity to ask questions and all were answered. The patient agreed with the plan and demonstrated an understanding of the instructions.   The patient was advised to call back or seek an in-person evaluation if the symptoms worsen or if the condition fails to improve as anticipated.  The above assessment and management plan was discussed with the patient. The patient verbalized understanding of and has agreed to the management plan. Patient is aware to call the clinic if symptoms persist or worsen. Patient is aware when to return to the clinic for a follow-up visit. Patient educated on when it is appropriate to go to the emergency department.   Time call ended:  5:09 pm  I provided 6 minutes of non-face-to-face time during this encounter.    Jannifer Rodney, FNP

## 2019-04-27 ENCOUNTER — Ambulatory Visit (INDEPENDENT_AMBULATORY_CARE_PROVIDER_SITE_OTHER): Payer: BLUE CROSS/BLUE SHIELD | Admitting: Family

## 2019-04-27 ENCOUNTER — Encounter: Payer: Self-pay | Admitting: Family

## 2019-04-27 DIAGNOSIS — R0689 Other abnormalities of breathing: Secondary | ICD-10-CM | POA: Diagnosis not present

## 2019-04-27 DIAGNOSIS — M950 Acquired deformity of nose: Secondary | ICD-10-CM

## 2019-04-27 DIAGNOSIS — Q309 Congenital malformation of nose, unspecified: Secondary | ICD-10-CM

## 2019-04-27 NOTE — Progress Notes (Signed)
   Virtual Visit via telephone Note Due to COVID-19 pandemic this visit was conducted virtually. This visit type was conducted due to national recommendations for restrictions regarding the COVID-19 Pandemic (e.g. social distancing, sheltering in place) in an effort to limit this patient's exposure and mitigate transmission in our community. All issues noted in this document were discussed and addressed.  A physical exam was not performed with this format.  I connected with Luis Simpson on 04/27/19 at 9:00 AM by telephone and verified that I am speaking with the correct person using two identifiers. Luis Simpson is currently located at home and daughter is currently with him during visit. The provider, Jannifer Rodney, FNP is located in their office at time of visit.  I discussed the limitations, risks, security and privacy concerns of performing an evaluation and management service by telephone and the availability of in person appointments. I also discussed with the patient that there may be a patient responsible charge related to this service. The patient expressed understanding and agreed to proceed.   History and Present Illness:  HPI  Pt calls the office today with constant nose stuffiness and hard to breathe through both nostrils. He states about 10 years ago he was hit in his nose several times with wrestling and softball injuries. Never was diagnosed with a broken nose, but states his nose was painful and deformed.   He states over the years his breathing through his nose has become more difficult and would like a referral to ENT.   Review of Systems  HENT: Positive for congestion.   All other systems reviewed and are negative.    Observations/Objective: No SOB or distress noted  Assessment and Plan: 1. Nose abnormality - Ambulatory referral to ENT  2. Difficulty breathing - Ambulatory referral to ENT  Avoid injury Referral to ENT placed     I discussed the  assessment and treatment plan with the patient. The patient was provided an opportunity to ask questions and all were answered. The patient agreed with the plan and demonstrated an understanding of the instructions.   The patient was advised to call back or seek an in-person evaluation if the symptoms worsen or if the condition fails to improve as anticipated.  The above assessment and management plan was discussed with the patient. The patient verbalized understanding of and has agreed to the management plan. Patient is aware to call the clinic if symptoms persist or worsen. Patient is aware when to return to the clinic for a follow-up visit. Patient educated on when it is appropriate to go to the emergency department.   Time call ended:  9:12 AM  I provided 12 minutes of non-face-to-face time during this encounter.    Jannifer Rodney, FNP

## 2019-05-31 ENCOUNTER — Other Ambulatory Visit: Payer: Self-pay

## 2019-05-31 ENCOUNTER — Encounter (HOSPITAL_BASED_OUTPATIENT_CLINIC_OR_DEPARTMENT_OTHER): Payer: Self-pay | Admitting: Otolaryngology

## 2019-06-01 ENCOUNTER — Other Ambulatory Visit: Payer: Self-pay | Admitting: Otolaryngology

## 2019-06-04 ENCOUNTER — Other Ambulatory Visit (HOSPITAL_COMMUNITY)
Admission: RE | Admit: 2019-06-04 | Discharge: 2019-06-04 | Disposition: A | Discharge status: Home / Self Care | Payer: BLUE CROSS/BLUE SHIELD | Source: Ambulatory Visit | Attending: Otolaryngology | Admitting: Otolaryngology

## 2019-06-04 DIAGNOSIS — Z01812 Encounter for preprocedural laboratory examination: Secondary | ICD-10-CM | POA: Diagnosis present

## 2019-06-04 DIAGNOSIS — Z20822 Contact with and (suspected) exposure to covid-19: Secondary | ICD-10-CM | POA: Diagnosis not present

## 2019-06-04 LAB — SARS CORONAVIRUS 2 (TAT 6-24 HRS): SARS Coronavirus 2: NEGATIVE

## 2019-06-08 ENCOUNTER — Ambulatory Visit (HOSPITAL_BASED_OUTPATIENT_CLINIC_OR_DEPARTMENT_OTHER): Payer: BLUE CROSS/BLUE SHIELD | Admitting: Anesthesiology

## 2019-06-08 ENCOUNTER — Encounter (HOSPITAL_BASED_OUTPATIENT_CLINIC_OR_DEPARTMENT_OTHER): Payer: Self-pay | Admitting: Otolaryngology

## 2019-06-08 ENCOUNTER — Other Ambulatory Visit: Payer: Self-pay

## 2019-06-08 ENCOUNTER — Ambulatory Visit (HOSPITAL_BASED_OUTPATIENT_CLINIC_OR_DEPARTMENT_OTHER)
Admission: RE | Admit: 2019-06-08 | Discharge: 2019-06-08 | Disposition: A | Discharge status: Home / Self Care | Payer: BLUE CROSS/BLUE SHIELD | Attending: Otolaryngology | Admitting: Otolaryngology

## 2019-06-08 ENCOUNTER — Encounter (HOSPITAL_BASED_OUTPATIENT_CLINIC_OR_DEPARTMENT_OTHER)
Admission: RE | Disposition: A | Discharge status: Home / Self Care | Payer: Self-pay | Source: Home / Self Care | Attending: Otolaryngology

## 2019-06-08 DIAGNOSIS — J3489 Other specified disorders of nose and nasal sinuses: Secondary | ICD-10-CM | POA: Diagnosis not present

## 2019-06-08 DIAGNOSIS — Z87891 Personal history of nicotine dependence: Secondary | ICD-10-CM | POA: Diagnosis not present

## 2019-06-08 DIAGNOSIS — J342 Deviated nasal septum: Secondary | ICD-10-CM | POA: Insufficient documentation

## 2019-06-08 DIAGNOSIS — J343 Hypertrophy of nasal turbinates: Secondary | ICD-10-CM | POA: Insufficient documentation

## 2019-06-08 HISTORY — PX: NASAL SEPTOPLASTY W/ TURBINOPLASTY: SHX2070

## 2019-06-08 SURGERY — SEPTOPLASTY, NOSE, WITH NASAL TURBINATE REDUCTION
Anesthesia: General | Site: Nose | Laterality: Bilateral

## 2019-06-08 MED ORDER — OXYCODONE HCL 5 MG PO TABS
5.0000 mg | ORAL_TABLET | Freq: Once | ORAL | Status: AC | PRN
  Administered 2019-06-08: 5 mg via ORAL

## 2019-06-08 MED ORDER — DEXAMETHASONE SODIUM PHOSPHATE 4 MG/ML IJ SOLN
INTRAMUSCULAR | Status: DC | PRN
  Administered 2019-06-08: 4 mg via INTRAVENOUS

## 2019-06-08 MED ORDER — ROCURONIUM BROMIDE 100 MG/10ML IV SOLN
INTRAVENOUS | Status: DC | PRN
  Administered 2019-06-08: 60 mg via INTRAVENOUS

## 2019-06-08 MED ORDER — ACETAMINOPHEN 500 MG PO TABS
1000.0000 mg | ORAL_TABLET | Freq: Once | ORAL | Status: AC
  Administered 2019-06-08: 1000 mg via ORAL

## 2019-06-08 MED ORDER — MIDAZOLAM HCL 2 MG/2ML IJ SOLN
INTRAMUSCULAR | Status: AC
  Filled 2019-06-08: qty 2

## 2019-06-08 MED ORDER — DEXMEDETOMIDINE HCL IN NACL 200 MCG/50ML IV SOLN
INTRAVENOUS | Status: DC | PRN
  Administered 2019-06-08 (×2): 8 ug via INTRAVENOUS

## 2019-06-08 MED ORDER — MUPIROCIN 2 % EX OINT
TOPICAL_OINTMENT | CUTANEOUS | Status: DC | PRN
  Administered 2019-06-08: 1 via TOPICAL

## 2019-06-08 MED ORDER — PROMETHAZINE HCL 25 MG/ML IJ SOLN: 6.2500 mg | INTRAMUSCULAR | Status: DC | PRN

## 2019-06-08 MED ORDER — PROPOFOL 10 MG/ML IV BOLUS
INTRAVENOUS | Status: AC
  Filled 2019-06-08: qty 20

## 2019-06-08 MED ORDER — FENTANYL CITRATE (PF) 100 MCG/2ML IJ SOLN
INTRAMUSCULAR | Status: AC
  Filled 2019-06-08: qty 2

## 2019-06-08 MED ORDER — CEFAZOLIN SODIUM-DEXTROSE 1-4 GM/50ML-% IV SOLN
INTRAVENOUS | Status: DC | PRN
  Administered 2019-06-08: 2 g via INTRAVENOUS

## 2019-06-08 MED ORDER — HYDROMORPHONE HCL 1 MG/ML IJ SOLN
0.2500 mg | INTRAMUSCULAR | Status: DC | PRN
  Administered 2019-06-08: 0.5 mg via INTRAVENOUS

## 2019-06-08 MED ORDER — PROPOFOL 10 MG/ML IV BOLUS
INTRAVENOUS | Status: DC | PRN
  Administered 2019-06-08: 50 mg via INTRAVENOUS
  Administered 2019-06-08: 200 mg via INTRAVENOUS

## 2019-06-08 MED ORDER — DEXAMETHASONE SODIUM PHOSPHATE 10 MG/ML IJ SOLN
INTRAMUSCULAR | Status: AC
  Filled 2019-06-08: qty 1

## 2019-06-08 MED ORDER — LIDOCAINE 2% (20 MG/ML) 5 ML SYRINGE
INTRAMUSCULAR | Status: AC
  Filled 2019-06-08: qty 5

## 2019-06-08 MED ORDER — ONDANSETRON HCL 4 MG/2ML IJ SOLN
INTRAMUSCULAR | Status: DC | PRN
  Administered 2019-06-08: 4 mg via INTRAVENOUS

## 2019-06-08 MED ORDER — LIDOCAINE-EPINEPHRINE 1 %-1:100000 IJ SOLN
INTRAMUSCULAR | Status: DC | PRN
  Administered 2019-06-08: 3.5 mL

## 2019-06-08 MED ORDER — OXYCODONE HCL 5 MG PO TABS
ORAL_TABLET | ORAL | Status: AC
  Filled 2019-06-08: qty 1

## 2019-06-08 MED ORDER — LACTATED RINGERS IV SOLN: INTRAVENOUS | Status: DC

## 2019-06-08 MED ORDER — AMOXICILLIN 875 MG PO TABS: 875.0000 mg | ORAL_TABLET | Freq: Two times a day (BID) | ORAL | 0 refills | Status: AC

## 2019-06-08 MED ORDER — ONDANSETRON HCL 4 MG/2ML IJ SOLN
INTRAMUSCULAR | Status: AC
  Filled 2019-06-08: qty 2

## 2019-06-08 MED ORDER — ROCURONIUM BROMIDE 10 MG/ML (PF) SYRINGE
PREFILLED_SYRINGE | INTRAVENOUS | Status: AC
  Filled 2019-06-08: qty 10

## 2019-06-08 MED ORDER — MIDAZOLAM HCL 5 MG/5ML IJ SOLN
INTRAMUSCULAR | Status: DC | PRN
  Administered 2019-06-08: 2 mg via INTRAVENOUS

## 2019-06-08 MED ORDER — KETOROLAC TROMETHAMINE 30 MG/ML IJ SOLN: 30.0000 mg | Freq: Once | INTRAMUSCULAR | Status: DC | PRN

## 2019-06-08 MED ORDER — HYDROMORPHONE HCL 1 MG/ML IJ SOLN
INTRAMUSCULAR | Status: AC
  Filled 2019-06-08: qty 0.5

## 2019-06-08 MED ORDER — OXYCODONE HCL 5 MG/5ML PO SOLN: 5.0000 mg | Freq: Once | ORAL | Status: AC | PRN

## 2019-06-08 MED ORDER — OXYCODONE-ACETAMINOPHEN 5-325 MG PO TABS: 1.0000 | ORAL_TABLET | ORAL | 0 refills | Status: AC | PRN

## 2019-06-08 MED ORDER — ACETAMINOPHEN 500 MG PO TABS
ORAL_TABLET | ORAL | Status: AC
  Filled 2019-06-08: qty 2

## 2019-06-08 MED ORDER — MEPERIDINE HCL 25 MG/ML IJ SOLN: 6.2500 mg | INTRAMUSCULAR | Status: DC | PRN

## 2019-06-08 MED ORDER — LIDOCAINE HCL (CARDIAC) PF 100 MG/5ML IV SOSY
PREFILLED_SYRINGE | INTRAVENOUS | Status: DC | PRN
  Administered 2019-06-08: 100 mg via INTRAVENOUS

## 2019-06-08 MED ORDER — OXYMETAZOLINE HCL 0.05 % NA SOLN
NASAL | Status: DC | PRN
  Administered 2019-06-08: 1 via TOPICAL

## 2019-06-08 MED ORDER — FENTANYL CITRATE (PF) 100 MCG/2ML IJ SOLN
INTRAMUSCULAR | Status: DC | PRN
  Administered 2019-06-08: 100 ug via INTRAVENOUS

## 2019-06-08 MED ORDER — SUGAMMADEX SODIUM 200 MG/2ML IV SOLN
INTRAVENOUS | Status: DC | PRN
  Administered 2019-06-08: 200 mg via INTRAVENOUS

## 2019-06-08 SURGICAL SUPPLY — 40 items
ATTRACTOMAT 16X20 MAGNETIC DRP (DRAPES) IMPLANT
CANISTER SUCT 1200ML W/VALVE (MISCELLANEOUS) ×3 IMPLANT
COAGULATOR SUCT 8FR VV (MISCELLANEOUS) ×3 IMPLANT
COVER WAND RF STERILE (DRAPES) IMPLANT
DECANTER SPIKE VIAL GLASS SM (MISCELLANEOUS) IMPLANT
DRSG NASOPORE 8CM (GAUZE/BANDAGES/DRESSINGS) IMPLANT
DRSG TELFA 3X8 NADH (GAUZE/BANDAGES/DRESSINGS) IMPLANT
ELECT REM PT RETURN 9FT ADLT (ELECTROSURGICAL) ×3
ELECTRODE REM PT RTRN 9FT ADLT (ELECTROSURGICAL) ×1 IMPLANT
GLOVE BIO SURGEON STRL SZ 6.5 (GLOVE) ×1 IMPLANT
GLOVE BIO SURGEON STRL SZ7.5 (GLOVE) ×3 IMPLANT
GLOVE BIO SURGEONS STRL SZ 6.5 (GLOVE) ×1
GLOVE BIOGEL PI IND STRL 6.5 (GLOVE) IMPLANT
GLOVE BIOGEL PI IND STRL 7.5 (GLOVE) IMPLANT
GLOVE BIOGEL PI INDICATOR 6.5 (GLOVE) ×2
GLOVE BIOGEL PI INDICATOR 7.5 (GLOVE) ×2
GLOVE SURG SS PI 7.0 STRL IVOR (GLOVE) ×2 IMPLANT
GOWN STRL REUS W/ TWL LRG LVL3 (GOWN DISPOSABLE) ×2 IMPLANT
GOWN STRL REUS W/TWL LRG LVL3 (GOWN DISPOSABLE) ×9
NDL HYPO 25X1 1.5 SAFETY (NEEDLE) ×1 IMPLANT
NEEDLE HYPO 25X1 1.5 SAFETY (NEEDLE) ×3 IMPLANT
NS IRRIG 1000ML POUR BTL (IV SOLUTION) ×3 IMPLANT
PACK BASIN DAY SURGERY FS (CUSTOM PROCEDURE TRAY) ×3 IMPLANT
PACK ENT DAY SURGERY (CUSTOM PROCEDURE TRAY) ×3 IMPLANT
PAD DRESSING TELFA 3X8 NADH (GAUZE/BANDAGES/DRESSINGS) IMPLANT
SLEEVE SCD COMPRESS KNEE MED (MISCELLANEOUS) ×2 IMPLANT
SOLUTION BUTLER CLEAR DIP (MISCELLANEOUS) ×3 IMPLANT
SPLINT NASAL AIRWAY SILICONE (MISCELLANEOUS) ×3 IMPLANT
SPONGE GAUZE 2X2 8PLY STER LF (GAUZE/BANDAGES/DRESSINGS) ×1
SPONGE GAUZE 2X2 8PLY STRL LF (GAUZE/BANDAGES/DRESSINGS) ×2 IMPLANT
SPONGE NEURO XRAY DETECT 1X3 (DISPOSABLE) ×3 IMPLANT
SUT CHROMIC 4 0 P 3 18 (SUTURE) ×3 IMPLANT
SUT PLAIN 4 0 ~~LOC~~ 1 (SUTURE) ×3 IMPLANT
SUT PROLENE 3 0 PS 2 (SUTURE) ×3 IMPLANT
SUT VIC AB 4-0 P-3 18XBRD (SUTURE) IMPLANT
SUT VIC AB 4-0 P3 18 (SUTURE)
TOWEL GREEN STERILE FF (TOWEL DISPOSABLE) ×3 IMPLANT
TUBE SALEM SUMP 12R W/ARV (TUBING) IMPLANT
TUBE SALEM SUMP 16 FR W/ARV (TUBING) ×3 IMPLANT
YANKAUER SUCT BULB TIP NO VENT (SUCTIONS) ×3 IMPLANT

## 2019-06-08 NOTE — Discharge Instructions (Addendum)

## 2019-06-08 NOTE — Transfer of Care (Signed)
Immediate Anesthesia Transfer of Care Note  Patient: Luis Simpson  Procedure(s) Performed: NASAL SEPTOPLASTY WITH  BILATERAL TURBINATE REDUCTION (Bilateral Nose)  Patient Location: PACU  Anesthesia Type:General  Level of Consciousness: awake, alert , oriented and patient cooperative  Airway & Oxygen Therapy: Patient Spontanous Breathing and Patient connected to face mask oxygen  Post-op Assessment: Report given to RN, Post -op Vital signs reviewed and stable and Patient moving all extremities  Post vital signs: Reviewed and stable  Last Vitals:  Vitals Value Taken Time  BP    Temp    Pulse 101 06/08/19 1020  Resp 11 06/08/19 1020  SpO2 98 % 06/08/19 1020  Vitals shown include unvalidated device data.  Last Pain:  Vitals:   06/08/19 0748  TempSrc: Tympanic  PainSc: 0-No pain      Patients Stated Pain Goal: 5 (06/08/19 0748)  Complications: No apparent anesthesia complications

## 2019-06-08 NOTE — Anesthesia Preprocedure Evaluation (Addendum)
Anesthesia Evaluation  Patient identified by MRN, date of birth, ID band Patient awake    Reviewed: Allergy & Precautions, NPO status , Patient's Chart, lab work & pertinent test results  Airway Mallampati: III  TM Distance: >3 FB Neck ROM: Full  Mouth opening: Limited Mouth Opening  Dental no notable dental hx. (+) Teeth Intact, Dental Advisory Given   Pulmonary former smoker,  15 pack year history, quit in 2016   Pulmonary exam normal breath sounds clear to auscultation       Cardiovascular negative cardio ROS Normal cardiovascular exam Rhythm:Regular Rate:Normal     Neuro/Psych negative neurological ROS  negative psych ROS   GI/Hepatic Neg liver ROS, GERD  Medicated and Controlled,  Endo/Other  Obesity BMI 39  Renal/GU negative Renal ROS  negative genitourinary   Musculoskeletal negative musculoskeletal ROS (+)   Abdominal (+) + obese,   Peds negative pediatric ROS (+)  Hematology negative hematology ROS (+)   Anesthesia Other Findings   Reproductive/Obstetrics negative OB ROS                            Anesthesia Physical Anesthesia Plan  ASA: III  Anesthesia Plan: General   Post-op Pain Management:    Induction: Intravenous  PONV Risk Score and Plan: 3 and Ondansetron, Dexamethasone, Midazolam and Treatment may vary due to age or medical condition  Airway Management Planned: Oral ETT and Video Laryngoscope Planned  Additional Equipment: None  Intra-op Plan:   Post-operative Plan: Extubation in OR  Informed Consent: I have reviewed the patients History and Physical, chart, labs and discussed the procedure including the risks, benefits and alternatives for the proposed anesthesia with the patient or authorized representative who has indicated his/her understanding and acceptance.     Dental advisory given  Plan Discussed with: CRNA  Anesthesia Plan Comments:         Anesthesia Quick Evaluation

## 2019-06-08 NOTE — Op Note (Signed)
DATE OF PROCEDURE: 06/08/2019  OPERATIVE REPORT   SURGEON: Newman Pies, MD   PREOPERATIVE DIAGNOSES:  1. Severe nasal septal deviation.  2. Bilateral inferior turbinate hypertrophy.  3. Chronic nasal obstruction.  POSTOPERATIVE DIAGNOSES:  1. Severe nasal septal deviation.  2. Bilateral inferior turbinate hypertrophy.  3. Chronic nasal obstruction.  PROCEDURE PERFORMED:  1. Septoplasty.  2. Bilateral partial inferior turbinate resection.   ANESTHESIA: General endotracheal tube anesthesia.   COMPLICATIONS: None.   ESTIMATED BLOOD LOSS: 100 mL.   INDICATION FOR PROCEDURE: Luis Simpson is a 34 y.o. male with a history of chronic nasal obstruction. The patient was treated with antihistamine, decongestant, and steroid nasal sprays. However, the patient continued to be symptomatic. On examination, the patient was noted to have bilateral severe inferior turbinate hypertrophy and significant nasal septal deviation, causing significant nasal obstruction. Based on the above findings, the decision was made for the patient to undergo the above-stated procedures. The risks, benefits, alternatives, and details of the procedures were discussed with the patient. Questions were invited and answered. Informed consent was obtained.   DESCRIPTION OF PROCEDURE: The patient was taken to the operating room and placed supine on the operating table. General endotracheal tube anesthesia was administered by the anesthesiologist. The patient was positioned, and prepped and draped in the standard fashion for nasal surgery. Pledgets soaked with Afrin were placed in both nasal cavities for decongestion. The pledgets were subsequently removed.   Examination of the nasal cavity revealed a severe nasal septal deviation. 1% lidocaine with 1:100,000 epinephrine was injected onto the nasal septum bilaterally. A hemitransfixion incision was made on the left side. The mucosal flap was carefully elevated on the left side. A  cartilaginous incision was made 1 cm superior to the caudal margin of the nasal septum. Mucosal flap was also elevated on the right side in the similar fashion. It should be noted that due to the severe septal deviation, the deviated portion of the cartilaginous and bony septum had to be removed in piecemeal fashion. Once the deviated portions were removed, a straight midline septum was achieved. The septum was then quilted with 4-0 plain gut sutures. The hemitransfixion incision was closed with interrupted 4-0 chromic sutures.   The inferior one half of both hypertrophied inferior turbinate was crossclamped with a Kelly clamp. The inferior one half of each inferior turbinate was then resected with a pair of cross cutting scissors. Hemostasis was achieved with a suction cautery device. Doyle splints were applied to the nasal septum.  The care of the patient was turned over to the anesthesiologist. The patient was awakened from anesthesia without difficulty. The patient was extubated and transferred to the recovery room in good condition.   OPERATIVE FINDINGS: Severe nasal septal deviation and bilateral inferior turbinate hypertrophy.   SPECIMEN: None.   FOLLOWUP CARE: The patient be discharged home once he is awake and alert. The patient will be placed on Percocet 1 tablets p.o. q.4 hours p.r.n. pain, and amoxicillin 875 mg p.o. b.i.d. for 3 days. The patient will follow up in my office in 3 days for splint removal.   Sunnie Odden Philomena Doheny, MD

## 2019-06-08 NOTE — Anesthesia Procedure Notes (Signed)
Procedure Name: Intubation Date/Time: 06/08/2019 9:04 AM Performed by: Lucinda Dell, CRNA Pre-anesthesia Checklist: Patient identified, Emergency Drugs available, Suction available and Patient being monitored Patient Re-evaluated:Patient Re-evaluated prior to induction Oxygen Delivery Method: Circle system utilized Preoxygenation: Pre-oxygenation with 100% oxygen Induction Type: IV induction Ventilation: Oral airway inserted - appropriate to patient size and Two handed mask ventilation required Laryngoscope Size: Glidescope and 4 Grade View: Grade I Tube type: Oral Rae Tube size: 8.0 mm Number of attempts: 1 Airway Equipment and Method: Stylet,  Oral airway and Video-laryngoscopy Placement Confirmation: ETT inserted through vocal cords under direct vision,  positive ETCO2 and breath sounds checked- equal and bilateral Secured at: 22 cm Tube secured with: Tape Dental Injury: Teeth and Oropharynx as per pre-operative assessment

## 2019-06-08 NOTE — H&P (Signed)
Cc: Chronic nasal obstruction  HPI: The patient is a 34 y/o male who presents today for evaluation of chronic nasal congestion. The patient is seen in consultation requested by [place]. The patient has noted difficulty breathing through his nose for many years. He has had several nasal traumas in the past. The patient states his wife complains that he snores loudly with possible apnea episodes. The patient has tried multiple steroid nasal sprays with no relief. He has occasional allergy symptoms. He has no history of recurrent sinus infections. Previous ENT surgery is denied.   The patient's review of systems (constitutional, eyes, ENT, cardiovascular, respiratory, GI, musculoskeletal, skin, neurologic, psychiatric, endocrine, hematologic, allergic) is noted in the ROS questionnaire.  It is reviewed with the patient.   Family health history: None.  Major events: None.  Ongoing medical problems: None.  Social history: The patient is married. He is a former smoker. He denies the use of alcohol or illegal drugs.   Exam General: Communicates without difficulty, well nourished, no acute distress. Head: Normocephalic, no evidence injury, no tenderness, facial buttresses intact without stepoff. Eyes: PERRL, EOMI. No scleral icterus, conjunctivae clear. Neuro: CN II exam reveals vision grossly intact.  No nystagmus at any point of gaze. Ears: Auricles well formed without lesions.  Ear canals are intact without mass or lesion.  No erythema or edema is appreciated.  The TMs are intact without fluid. Nose: External evaluation reveals normal support and skin without lesions.  Dorsum is intact.  Anterior rhinoscopy reveals congested and edematous mucosa over anterior aspect of the inferior turbinates and nasal septum.  No purulence is noted. Middle meatus is not well visualized. Oral:  Oral cavity and oropharynx are intact, symmetric, without erythema or edema.  Mucosa is moist without  lesions. Neck: Full range of motion without pain.  There is no significant lymphadenopathy.  No masses palpable.  Thyroid bed within normal limits to palpation.  Parotid glands and submandibular glands equal bilaterally without mass.  Trachea is midline. Neuro:  CN 2-12 grossly intact. Gait normal. Vestibular: No nystagmus at any point of gaze.   Procedure: Flexible Nasal Endoscopy Description: Risks, benefits, and alternatives of flexible endoscopy were explained to the patient. Specific mention was made of the risk of throat numbness with difficulty swallowing, possible bleeding from the nose and mouth, and pain from the procedure. The patient gave oral consent to proceed.  The flexible scope was inserted into the right nasal cavity. Endoscopy of the interior nasal cavity, superior, inferior, and middle meatus was performed. The sphenoid-ethmoid recess was examined. Edematous mucosa was noted. No polyp, mass, or lesion was appreciated. Olfactory cleft was clear. Nasopharynx was clear. Turbinates were hypertrophied but without mass. Incomplete response to decongestion. The procedure was repeated on the contralateral side with severe NSD. The patient tolerated the procedure well.   Assessment 1. 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  No purulent drainage, polyps, or other suspicious mass or lesion is noted on today's nasal endoscopy.  Plan  1. The physical exam and nasal endoscopy findings are reviewed with the patient.  2. Treatment options include conservative management with daily steroid nasal spray versus septoplasty and bilateral inferior turbinate reduction. The risks, benefits, alternatives, and details of the procedure are reviewed with the patient. Questions are invited and answered. 3. The patient is interested in proceeding with the procedure.  We will schedule the procedure in accordance with the family  schedule.

## 2019-06-08 NOTE — Anesthesia Postprocedure Evaluation (Signed)
Anesthesia Post Note  Patient: Luis Simpson  Procedure(s) Performed: NASAL SEPTOPLASTY WITH  BILATERAL TURBINATE REDUCTION (Bilateral Nose)     Patient location during evaluation: PACU Anesthesia Type: General Level of consciousness: awake and alert, oriented and patient cooperative Pain management: pain level controlled Vital Signs Assessment: post-procedure vital signs reviewed and stable Respiratory status: spontaneous breathing, nonlabored ventilation and respiratory function stable Cardiovascular status: blood pressure returned to baseline and stable Postop Assessment: no apparent nausea or vomiting Anesthetic complications: no    Last Vitals:  Vitals:   06/08/19 1016 06/08/19 1017  BP: (!) 141/92 (!) 141/92  Pulse: 61 (!) 106  Resp: 12 10  Temp:  37 C  SpO2: 98% 98%    Last Pain:  Vitals:   06/08/19 1017  TempSrc:   PainSc: 0-No pain                 Lannie Fields

## 2019-06-09 ENCOUNTER — Encounter: Payer: Self-pay | Admitting: *Deleted

## 2019-06-19 ENCOUNTER — Emergency Department (HOSPITAL_COMMUNITY)
Admission: EM | Admit: 2019-06-19 | Discharge: 2019-06-19 | Disposition: A | Discharge status: Home / Self Care | Payer: BLUE CROSS/BLUE SHIELD | Attending: Emergency Medicine | Admitting: Emergency Medicine

## 2019-06-19 ENCOUNTER — Other Ambulatory Visit: Payer: Self-pay

## 2019-06-19 ENCOUNTER — Encounter (HOSPITAL_COMMUNITY): Payer: Self-pay | Admitting: Emergency Medicine

## 2019-06-19 DIAGNOSIS — R040 Epistaxis: Secondary | ICD-10-CM | POA: Diagnosis not present

## 2019-06-19 DIAGNOSIS — Z79899 Other long term (current) drug therapy: Secondary | ICD-10-CM | POA: Insufficient documentation

## 2019-06-19 DIAGNOSIS — Z87891 Personal history of nicotine dependence: Secondary | ICD-10-CM | POA: Diagnosis not present

## 2019-06-19 MED ORDER — AMOXICILLIN 875 MG PO TABS: 875.0000 mg | ORAL_TABLET | Freq: Two times a day (BID) | ORAL | 0 refills | Status: AC

## 2019-06-19 NOTE — ED Provider Notes (Signed)
[facility] EMERGENCY DEPARTMENT Provider Note   CSN: [id number] Arrival date & time: 06/19/19  7867     History Chief Complaint  Patient presents with  . Epistaxis    Luis Simpson is a 35 y.o. male.  HPI      Luis Simpson is a 34 y.o. male, with a history of nasal septoplasty, presenting to the ED with epistaxis beginning around 4 AM this morning. He also has a sensation of pressure into the sinuses. Patient underwent septoplasty June 08, 2019, performed by Dr. Suszanne Conners.  He states he has not had any issues since his surgery until today. He called Dr. Avel Sensor office and was told to come to the ED. Upon my evaluation of the patient, he states Dr. Suszanne Conners had already seen him in the waiting room and placed nasal packing.  He told the patient he would be back to check on him.  Patient denies fever, vomiting, difficulty breathing.   Past Medical History:  Diagnosis Date  . Constipation     Patient Active Problem List   Diagnosis Date Noted  . Left knee sprain 02/26/2019  . 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 Hypertriglyceridemia 08/02/2014  . Gastroesophageal reflux disease 04/15/2014  . Obesity (BMI 30.0-34.9) 04/15/2014  . Paresthesia of both hands 04/15/2014  . Tobacco use 04/15/2014    Past Surgical History:  Procedure Laterality Date  . NASAL SEPTOPLASTY W/ TURBINOPLASTY Bilateral 06/08/2019   Procedure: NASAL SEPTOPLASTY WITH  BILATERAL TURBINATE REDUCTION;  Surgeon: Newman Pies, MD;  Location: [facility];  Service: ENT;  Laterality: Bilateral;  . WISDOM TOOTH EXTRACTION         Family History  Problem Relation Age of Onset  . Diverticulitis Father   . Colon cancer Neg Hx   . Rectal cancer Neg Hx   . Stomach cancer Neg Hx     Social History   Tobacco Use  . Smoking status: Former Smoker    Packs/day: 1.00    Years: 15.00    Pack years: 15.00    Types: Cigarettes    Quit date: 01/20/2015    Years  since quitting: 4.4  . Smokeless tobacco: Never Used  Substance Use Topics  . Alcohol use: No    Comment: rarely  . Drug use: No    Home Medications Prior to Admission medications   Medication Sig Start Date End Date Taking? Authorizing Provider  amoxicillin (AMOXIL) 875 MG tablet Take 1 tablet (875 mg total) by mouth 2 (two) times daily for 3 days. 06/19/19 06/22/19  Tyri Elmore, Ines Bloomer C, PA-C  Multiple Vitamin (MULTIVITAMIN) capsule Take 1 capsule by mouth daily.    [provider]    Allergies    Patient has no known allergies.  Review of Systems   Review of Systems  Constitutional: Negative for fever.  HENT: Positive for nosebleeds. Negative for facial swelling, sore throat and trouble swallowing.   Gastrointestinal: Negative for nausea and vomiting.  Musculoskeletal: Negative for neck pain.  Neurological: Negative for dizziness, light-headedness and headaches.    Physical Exam Updated Vital Signs BP (!) 121/91 (BP Location: Left Arm)   Pulse (!) 108   Temp 98.3 F (36.8 C) (Oral)   Resp 20   Wt 106.6 kg   SpO2 98%   BMI 36.81 kg/m   Physical Exam Vitals and nursing note reviewed.  Constitutional:      General: He is not in acute distress.    Appearance: He is  well-developed. He is not diaphoretic.  HENT:     Head: Normocephalic and atraumatic.     Nose:     Comments: Patient has some red blood stationery in the right nare.  No free-flowing hemorrhage noted. There is nasal packing in the left nare. No noted facial swelling.    Mouth/Throat:     Mouth: Mucous membranes are moist.     Pharynx: Oropharynx is clear.  Eyes:     Conjunctiva/sclera: Conjunctivae normal.  Cardiovascular:     Rate and Rhythm: Normal rate and regular rhythm.  Pulmonary:     Effort: Pulmonary effort is normal.  Musculoskeletal:     Cervical back: Normal range of motion and neck supple. No tenderness.  Skin:    General: Skin is warm and dry.     Coloration: Skin is not pale.    Neurological:     Mental Status: He is alert.  Psychiatric:        Behavior: Behavior normal.     ED Results / Procedures / Treatments   Labs (all labs ordered are listed, but only abnormal results are displayed) Labs Reviewed - No data to display  EKG None  Radiology No results found.  Procedures Procedures (including critical care time)  Medications Ordered in ED Medications - No data to display  ED Course  I have reviewed the triage vital signs and the nursing notes.  Pertinent labs & imaging results that were available during my care of the patient were reviewed by me and considered in my medical decision making (see chart for details).    MDM Rules/Calculators/A&P                      Patient presents with epistaxis a little over 10 days postop from nasal septoplasty. He was seen in the ED by his surgeon, Dr. Benjamine Mola, ENT.  Nasal packing was placed by ENT and it appears as though hemostasis was achieved. Dr. Benjamine Mola advises to have the patient follow-up in the office Monday, May 3 at 1 PM.  Nasal packing is to remain in place until that time.  Prescribed the patient amoxicillin 875 mg twice daily for 3 days. Return cautions discussed.  Patient voices understanding of these instructions, accepts the plan, and is comfortable with discharge.   Findings and plan of care discussed with Pattricia Boss, MD. Dr. Jeanell Sparrow personally evaluated and examined this patient.  Final Clinical Impression(s) / ED Diagnoses Final diagnoses:  Epistaxis    Rx / DC Orders ED Discharge Orders         Ordered    amoxicillin (AMOXIL) 875 MG tablet  2 times daily     06/19/19 0836           Lorayne Bender, PA-C 06/19/19 1402    Pattricia Boss, MD 06/19/19 1458

## 2019-06-19 NOTE — Discharge Instructions (Signed)
Please take all of your antibiotics until finished!   You may develop abdominal discomfort or diarrhea from the antibiotic.  You may help offset this with probiotics which you can buy or get in yogurt. Do not eat or take the probiotics until 2 hours after your antibiotic.   Follow-up with the ENT specialist in the office.  Call the ENT office or come to the ED for fever, increased pain, uncontrolled bleeding, or any other major complaints.

## 2019-06-19 NOTE — ED Triage Notes (Signed)
C/o nosebleed since 4am (mainly L nostril).  Pt states he believes bleeding has stopped.  He has nasal packaging in bilateral nares.  Pt had nasal septoplasty on 4/20.  States he called Dr. Suszanne Conners and told to come to ED.

## 2019-06-19 NOTE — ED Provider Notes (Signed)
MSE was initiated and I personally evaluated the patient and placed orders (if any) at  8:05 AM on Jun 19, 2019.  The patient appears stable so that the remainder of the MSE may be completed by another provider. Patient presents today with nosebleed. Being seen by Dr. Suszanne Conners in the department prior to my evaluation   Margarita Grizzle, MD 06/19/19 [phone]

## 2019-07-15 ENCOUNTER — Other Ambulatory Visit: Payer: Self-pay | Admitting: Family

## 2019-07-15 DIAGNOSIS — S8392XA Sprain of unspecified site of left knee, initial encounter: Secondary | ICD-10-CM

## 2019-07-15 DIAGNOSIS — M25562 Pain in left knee: Secondary | ICD-10-CM

## 2019-08-30 ENCOUNTER — Ambulatory Visit
Admission: EM | Admit: 2019-08-30 | Discharge: 2019-08-30 | Disposition: A | Discharge status: Home / Self Care | Payer: BLUE CROSS/BLUE SHIELD | Attending: Emergency Medicine | Admitting: Emergency Medicine

## 2019-08-30 ENCOUNTER — Encounter: Payer: Self-pay | Admitting: Emergency Medicine

## 2019-08-30 ENCOUNTER — Other Ambulatory Visit: Payer: Self-pay

## 2019-08-30 DIAGNOSIS — J029 Acute pharyngitis, unspecified: Secondary | ICD-10-CM | POA: Insufficient documentation

## 2019-08-30 DIAGNOSIS — R05 Cough: Secondary | ICD-10-CM | POA: Diagnosis not present

## 2019-08-30 DIAGNOSIS — Z1152 Encounter for screening for COVID-19: Secondary | ICD-10-CM | POA: Insufficient documentation

## 2019-08-30 DIAGNOSIS — R059 Cough, unspecified: Secondary | ICD-10-CM

## 2019-08-30 LAB — POCT RAPID STREP A (OFFICE): Rapid Strep A Screen: NEGATIVE

## 2019-08-30 MED ORDER — CETIRIZINE HCL 10 MG PO TABS: 10.0000 mg | ORAL_TABLET | Freq: Every day | ORAL | 0 refills | Status: DC

## 2019-08-30 MED ORDER — PREDNISONE 10 MG PO TABS: 20.0000 mg | ORAL_TABLET | Freq: Every day | ORAL | 0 refills | Status: DC

## 2019-08-30 MED ORDER — BENZONATATE 100 MG PO CAPS: 100.0000 mg | ORAL_CAPSULE | Freq: Three times a day (TID) | ORAL | 0 refills | Status: DC

## 2019-08-30 MED ORDER — FLUTICASONE PROPIONATE 50 MCG/ACT NA SUSP: 1.0000 | Freq: Every day | NASAL | 0 refills | Status: DC

## 2019-08-30 NOTE — Discharge Instructions (Signed)
COVID testing ordered.  It will take between 2-7 days for test results.  Someone will contact you regarding abnormal results.    In the meantime: You should remain isolated in your home for 10 days from symptom onset AND greater than 24 hours after symptoms resolution (absence of fever without the use of fever-reducing medication and improvement in respiratory symptoms), whichever is longer Get plenty of rest and push fluids Tessalon Perles prescribed for cough Zyrtec for nasal congestion, runny nose, and/or sore throat Flonase for nasal congestion and runny nose Use medications daily for symptom relief Use OTC medications like ibuprofen or tylenol as needed fever or pain Call or go to the ED if you have any new or worsening symptoms such as fever, worsening cough, shortness of breath, chest tightness, chest pain, turning blue, changes in mental status, etc...  

## 2019-08-30 NOTE — ED Provider Notes (Signed)
[facility]   [id number] 08/30/19 Arrival Time: 1909   Chief Complaint  Patient presents with  . Generalized Body Aches     SUBJECTIVE: History from: patient.  Luis Simpson is a 34 y.o. male who presented to the urgent care with a complaint of fever and chills, cough, sore throat, body aches, loss of taste and smell for the past 2 to 3 days.  Denies sick exposure to COVID, flu or strep.  Denies recent travel.  Denies aggravating or alleviating symptoms.  Denies previous COVID infection.   Denies  fatigue, nasal congestion, rhinorrhea,  cough, SOB, wheezing, chest pain, nausea, vomiting, changes in bowel or bladder habits.   .  ROS: As per HPI.  All other pertinent ROS negative.     Past Medical History:  Diagnosis Date  . Constipation    Past Surgical History:  Procedure Laterality Date  . NASAL SEPTOPLASTY W/ TURBINOPLASTY Bilateral 06/08/2019   Procedure: NASAL SEPTOPLASTY WITH  BILATERAL TURBINATE REDUCTION;  Surgeon: Newman Pies, MD;  Location: [facility];  Service: ENT;  Laterality: Bilateral;  . WISDOM TOOTH EXTRACTION     No Known Allergies No current facility-administered medications on file prior to encounter.   Current Outpatient Medications on File Prior to Encounter  Medication Sig Dispense Refill  . Multiple Vitamin (MULTIVITAMIN) capsule Take 1 capsule by mouth daily.     Social History   Socioeconomic History  . Marital status: Married    Spouse name: Not on file  . Number of children: 2  . Years of education: Not on file  . Highest education level: Not on file  Occupational History  . Occupation: Corporate investment banker  Tobacco Use  . Smoking status: Former Smoker    Packs/day: 1.00    Years: 15.00    Pack years: 15.00    Types: Cigarettes    Quit date: 01/20/2015    Years since quitting: 4.6  . Smokeless tobacco: Never Used  Vaping Use  . Vaping Use: Never used  Substance and Sexual Activity  . Alcohol use: No    Comment:  rarely  . Drug use: No  . Sexual activity: Yes    Partners: Female  Other Topics Concern  . Not on file  Social History Narrative  . Not on file   Social Determinants of Health   Financial Resource Strain:   . Difficulty of Paying Living Expenses:   Food Insecurity:   . Worried About Programme researcher, broadcasting/film/video in the Last Year:   . Barista in the Last Year:   Transportation Needs:   . Freight forwarder (Medical):   Marland Kitchen Lack of Transportation (Non-Medical):   Physical Activity:   . Days of Exercise per Week:   . Minutes of Exercise per Session:   Stress:   . Feeling of Stress :   Social Connections:   . Frequency of Communication with Friends and Family:   . Frequency of Social Gatherings with Friends and Family:   . Attends Religious Services:   . Active Member of Clubs or Organizations:   . Attends Banker Meetings:   Marland Kitchen Marital Status:   Intimate Partner Violence:   . Fear of Current or Ex-Partner:   . Emotionally Abused:   Marland Kitchen Physically Abused:   . Sexually Abused:    Family History  Problem Relation Age of Onset  . Diverticulitis Father   . Colon cancer Neg Hx   . Rectal cancer Neg  Hx   . Stomach cancer Neg Hx     OBJECTIVE:  Vitals:   08/30/19 1917 08/30/19 1918  BP: 133/89   Pulse: (!) 113   Resp: (!) 21   Temp: 99.3 F (37.4 C)   TempSrc: Oral   SpO2: 96%   Weight:  235 lb (106.6 kg)  Height:  5\' 7"  (1.702 m)     General appearance: alert; appears fatigued, but nontoxic; speaking in full sentences and tolerating own secretions HEENT: NCAT; Ears: EACs clear, TMs pearly gray; Eyes: PERRL.  EOM grossly intact. Sinuses: nontender; Nose: nares patent without rhinorrhea, Throat: oropharynx clear, tonsils non erythematous or enlarged, uvula midline  Neck: supple without LAD Lungs: unlabored respirations, symmetrical air entry; cough: absent; no respiratory distress; CTAB Heart: regular rate and rhythm.  Radial pulses 2+ symmetrical  bilaterally Skin: warm and dry Psychological: alert and cooperative; normal mood and affect  LABS:  Results for orders placed or performed during the hospital encounter of 08/30/19 (from the past 24 hour(s))  POCT rapid strep A     Status: None   Collection Time: 08/30/19  7:24 PM  Result Value Ref Range   Rapid Strep A Screen Negative Negative     ASSESSMENT & PLAN:  1. Sore throat   2. Encounter for screening for COVID-19   3. Cough     Meds ordered this encounter  Medications  . benzonatate (TESSALON) 100 MG capsule    Sig: Take 1 capsule (100 mg total) by mouth every 8 (eight) hours.    Dispense:  21 capsule    Refill:  0  . fluticasone (FLONASE) 50 MCG/ACT nasal spray    Sig: Place 1 spray into both nostrils daily for 14 days.    Dispense:  16 g    Refill:  0  . cetirizine (ZYRTEC ALLERGY) 10 MG tablet    Sig: Take 1 tablet (10 mg total) by mouth daily.    Dispense:  30 tablet    Refill:  0  . predniSONE (DELTASONE) 10 MG tablet    Sig: Take 2 tablets (20 mg total) by mouth daily for 5 days.    Dispense:  10 tablet    Refill:  0    Discharge Instructions   POCT strep test was negative/sample sent for culture, will call if result is abnormal COVID testing ordered.  It will take between 2-7 days for test results.  Someone will contact you regarding abnormal results.    In the meantime: You should remain isolated in your home for 10 days from symptom onset AND greater than 24 hours after symptoms resolution (absence of fever without the use of fever-reducing medication and improvement in respiratory symptoms), whichever is longer Get plenty of rest and push fluids Tessalon Perles prescribed for cough Zyrtec for nasal congestion, runny nose, and/or sore throat Flonase for nasal congestion and runny nose Use medications daily for symptom relief Use OTC medications like ibuprofen or tylenol as needed fever or pain Call or go to the ED if you have any new or  worsening symptoms such as fever, worsening cough, shortness of breath, chest tightness, chest pain, turning blue, changes in mental status, etc...   Reviewed expectations re: course of current medical issues. Questions answered. Outlined signs and symptoms indicating need for more acute intervention. Patient verbalized understanding. After Visit Summary given.      Note: This document was prepared using Dragon voice recognition software and may include unintentional dictation errors.  Durward Parcel, FNP 08/30/19 1933

## 2019-08-30 NOTE — ED Triage Notes (Signed)
Sore throat, body aches, chills and loss of smell.  Symptoms started Saturday.

## 2019-08-31 LAB — SARS-COV-2, NAA 2 DAY TAT

## 2019-08-31 LAB — NOVEL CORONAVIRUS, NAA: SARS-CoV-2, NAA: DETECTED — AB

## 2019-09-01 ENCOUNTER — Other Ambulatory Visit: Payer: Self-pay | Admitting: Physician Assistant

## 2019-09-01 DIAGNOSIS — E669 Obesity, unspecified: Secondary | ICD-10-CM

## 2019-09-01 DIAGNOSIS — U071 COVID-19: Secondary | ICD-10-CM

## 2019-09-01 MED ORDER — SODIUM CHLORIDE 0.9 % IV SOLN
Freq: Once | INTRAVENOUS | Status: AC
  Filled 2019-09-01: qty 5
  Filled 2019-09-01: qty 600

## 2019-09-01 NOTE — Progress Notes (Signed)
I connected by phone with Marcelo Baldy on 09/01/2019 at 12:53 PM to discuss the potential use of a new treatment for mild to moderate COVID-19 viral infection in non-hospitalized patients.  This patient is a 34 y.o. male that meets the FDA criteria for Emergency Use Authorization of COVID monoclonal antibody casirivimab/imdevimab.  Has a (+) direct SARS-CoV-2 viral test result  Has mild or moderate COVID-19   Is NOT hospitalized due to COVID-19  Is within 10 days of symptom onset  Has at least one of the high risk factor(s) for progression to severe COVID-19 and/or hospitalization as defined in EUA.  Specific high risk criteria : BMI > 25   I have spoken and communicated the following to the patient or parent/caregiver regarding COVID monoclonal antibody treatment:  1. FDA has authorized the emergency use for the treatment of mild to moderate COVID-19 in adults and pediatric patients with positive results of direct SARS-CoV-2 viral testing who are 54 years of age and older weighing at least 40 kg, and who are at high risk for progressing to severe COVID-19 and/or hospitalization.  2. The significant known and potential risks and benefits of COVID monoclonal antibody, and the extent to which such potential risks and benefits are unknown.  3. Information on available alternative treatments and the risks and benefits of those alternatives, including clinical trials.  4. Patients treated with COVID monoclonal antibody should continue to self-isolate and use infection control measures (e.g., wear mask, isolate, social distance, avoid sharing personal items, clean and disinfect "high touch" surfaces, and frequent handwashing) according to CDC guidelines.   5. The patient or parent/caregiver has the option to accept or refuse COVID monoclonal antibody treatment.  After reviewing this information with the patient, The patient agreed to proceed with receiving casirivimab\imdevimab infusion and  will be provided a copy of the Fact sheet prior to receiving the infusion.   Sx onset 7/11. Set up for infusion tomorrow 7/15 @ 12:30pm. Directions given to new [facility].   Cline Crock 09/01/2019 12:53 PM

## 2019-09-02 ENCOUNTER — Ambulatory Visit (HOSPITAL_COMMUNITY)
Admission: RE | Admit: 2019-09-02 | Discharge: 2019-09-02 | Disposition: A | Discharge status: Home / Self Care | Payer: BLUE CROSS/BLUE SHIELD | Source: Ambulatory Visit | Attending: Pulmonary Disease | Admitting: Pulmonary Disease

## 2019-09-02 ENCOUNTER — Inpatient Hospital Stay (HOSPITAL_COMMUNITY)
Admission: EM | Admit: 2019-09-02 | Discharge: 2019-09-07 | DRG: 177 | Disposition: A | Discharge status: Home / Self Care | Payer: BLUE CROSS/BLUE SHIELD | Attending: Family Medicine | Admitting: Family Medicine

## 2019-09-02 ENCOUNTER — Other Ambulatory Visit: Payer: Self-pay

## 2019-09-02 ENCOUNTER — Emergency Department (HOSPITAL_COMMUNITY): Payer: BLUE CROSS/BLUE SHIELD

## 2019-09-02 ENCOUNTER — Encounter (HOSPITAL_COMMUNITY): Payer: Self-pay

## 2019-09-02 DIAGNOSIS — J9601 Acute respiratory failure with hypoxia: Secondary | ICD-10-CM | POA: Diagnosis present

## 2019-09-02 DIAGNOSIS — U071 COVID-19: Principal | ICD-10-CM | POA: Diagnosis present

## 2019-09-02 DIAGNOSIS — R0602 Shortness of breath: Secondary | ICD-10-CM

## 2019-09-02 DIAGNOSIS — E871 Hypo-osmolality and hyponatremia: Secondary | ICD-10-CM | POA: Diagnosis present

## 2019-09-02 DIAGNOSIS — E669 Obesity, unspecified: Secondary | ICD-10-CM | POA: Insufficient documentation

## 2019-09-02 DIAGNOSIS — I10 Essential (primary) hypertension: Secondary | ICD-10-CM | POA: Diagnosis present

## 2019-09-02 DIAGNOSIS — Z79899 Other long term (current) drug therapy: Secondary | ICD-10-CM

## 2019-09-02 DIAGNOSIS — R7401 Elevation of levels of liver transaminase levels: Secondary | ICD-10-CM | POA: Diagnosis present

## 2019-09-02 DIAGNOSIS — K219 Gastro-esophageal reflux disease without esophagitis: Secondary | ICD-10-CM | POA: Diagnosis present

## 2019-09-02 DIAGNOSIS — R739 Hyperglycemia, unspecified: Secondary | ICD-10-CM | POA: Diagnosis present

## 2019-09-02 DIAGNOSIS — Z87891 Personal history of nicotine dependence: Secondary | ICD-10-CM

## 2019-09-02 DIAGNOSIS — E876 Hypokalemia: Secondary | ICD-10-CM | POA: Diagnosis not present

## 2019-09-02 DIAGNOSIS — Z6839 Body mass index (BMI) 39.0-39.9, adult: Secondary | ICD-10-CM

## 2019-09-02 DIAGNOSIS — J1282 Pneumonia due to coronavirus disease 2019: Secondary | ICD-10-CM | POA: Diagnosis present

## 2019-09-02 DIAGNOSIS — R439 Unspecified disturbances of smell and taste: Secondary | ICD-10-CM | POA: Diagnosis present

## 2019-09-02 LAB — C-REACTIVE PROTEIN: CRP: 6.5 mg/dL — ABNORMAL HIGH (ref <1.0)

## 2019-09-02 LAB — CBC WITH DIFFERENTIAL/PLATELET
Abs Immature Granulocytes: 0.01 10*3/uL (ref 0.00–0.07)
Basophils Absolute: 0 10*3/uL (ref 0.0–0.1)
Basophils Relative: 0 %
Eosinophils Absolute: 0 10*3/uL (ref 0.0–0.5)
Eosinophils Relative: 0 %
HCT: 40.3 % (ref 39.0–52.0)
Hemoglobin: 13.2 g/dL (ref 13.0–17.0)
Immature Granulocytes: 0 %
Lymphocytes Relative: 14 %
Lymphs Abs: 0.9 10*3/uL (ref 0.7–4.0)
MCH: 27.3 pg (ref 26.0–34.0)
MCHC: 32.8 g/dL (ref 30.0–36.0)
MCV: 83.4 fL (ref 80.0–100.0)
Monocytes Absolute: 0.2 10*3/uL (ref 0.1–1.0)
Monocytes Relative: 4 %
Neutro Abs: 5.3 10*3/uL (ref 1.7–7.7)
Neutrophils Relative %: 82 %
Platelets: 166 10*3/uL (ref 150–400)
RBC: 4.83 MIL/uL (ref 4.22–5.81)
RDW: 12.9 % (ref 11.5–15.5)
WBC: 6.5 10*3/uL (ref 4.0–10.5)
nRBC: 0 % (ref 0.0–0.2)

## 2019-09-02 LAB — COMPREHENSIVE METABOLIC PANEL
ALT: 54 U/L — ABNORMAL HIGH (ref 0–44)
AST: 46 U/L — ABNORMAL HIGH (ref 15–41)
Albumin: 3.6 g/dL (ref 3.5–5.0)
Alkaline Phosphatase: 55 U/L (ref 38–126)
Anion gap: 10 (ref 5–15)
BUN: 12 mg/dL (ref 6–20)
CO2: 19 mmol/L — ABNORMAL LOW (ref 22–32)
Calcium: 7.8 mg/dL — ABNORMAL LOW (ref 8.9–10.3)
Chloride: 105 mmol/L (ref 98–111)
Creatinine, Ser: 0.87 mg/dL (ref 0.61–1.24)
GFR calc Af Amer: >60 mL/min (ref >60)
GFR calc non Af Amer: >60 mL/min (ref >60)
Glucose, Bld: 109 mg/dL — ABNORMAL HIGH (ref 70–99)
Potassium: 3.1 mmol/L — ABNORMAL LOW (ref 3.5–5.1)
Sodium: 134 mmol/L — ABNORMAL LOW (ref 135–145)
Total Bilirubin: 0.4 mg/dL (ref 0.3–1.2)
Total Protein: 6.9 g/dL (ref 6.5–8.1)

## 2019-09-02 LAB — ABO/RH: ABO/RH(D): O POS

## 2019-09-02 LAB — D-DIMER, QUANTITATIVE: D-Dimer, Quant: 0.66 ug/mL-FEU — ABNORMAL HIGH (ref 0.00–0.50)

## 2019-09-02 LAB — FERRITIN: Ferritin: 170 ng/mL (ref 24–336)

## 2019-09-02 LAB — TRIGLYCERIDES: Triglycerides: 91 mg/dL (ref <150)

## 2019-09-02 LAB — FIBRINOGEN: Fibrinogen: 555 mg/dL — ABNORMAL HIGH (ref 210–475)

## 2019-09-02 LAB — PROCALCITONIN: Procalcitonin: 2.02 ng/mL

## 2019-09-02 LAB — LACTATE DEHYDROGENASE: LDH: 239 U/L — ABNORMAL HIGH (ref 98–192)

## 2019-09-02 LAB — LACTIC ACID, PLASMA: Lactic Acid, Venous: 0.9 mmol/L (ref 0.5–1.9)

## 2019-09-02 MED ORDER — DIPHENHYDRAMINE HCL 25 MG PO CAPS
25.0000 mg | ORAL_CAPSULE | Freq: Once | ORAL | Status: AC
  Administered 2019-09-02: 25 mg via ORAL
  Filled 2019-09-02: qty 1

## 2019-09-02 MED ORDER — LIDOCAINE 5 % EX PTCH: 1.0000 | MEDICATED_PATCH | CUTANEOUS | Status: AC

## 2019-09-02 MED ORDER — HYDROCOD POLST-CPM POLST ER 10-8 MG/5ML PO SUER: 5.0000 mL | Freq: Two times a day (BID) | ORAL | Status: DC | PRN

## 2019-09-02 MED ORDER — GUAIFENESIN-DM 100-10 MG/5ML PO SYRP: 10.0000 mL | ORAL_SOLUTION | ORAL | Status: DC | PRN

## 2019-09-02 MED ORDER — ASCORBIC ACID 500 MG PO TABS
500.0000 mg | ORAL_TABLET | Freq: Every day | ORAL | Status: DC
  Administered 2019-09-02 – 2019-09-03 (×2): 500 mg via ORAL
  Filled 2019-09-02 (×2): qty 1

## 2019-09-02 MED ORDER — ACETAMINOPHEN 325 MG PO TABS
650.0000 mg | ORAL_TABLET | Freq: Once | ORAL | Status: AC
  Administered 2019-09-02: 650 mg via ORAL
  Filled 2019-09-02: qty 2

## 2019-09-02 MED ORDER — ENOXAPARIN SODIUM 40 MG/0.4ML ~~LOC~~ SOLN
40.0000 mg | SUBCUTANEOUS | Status: DC
  Administered 2019-09-02 – 2019-09-03 (×2): 40 mg via SUBCUTANEOUS
  Filled 2019-09-02 (×2): qty 0.4

## 2019-09-02 MED ORDER — LACTATED RINGERS IV BOLUS: 1000.0000 mL | Freq: Once | INTRAVENOUS | Status: DC

## 2019-09-02 MED ORDER — METHYLPREDNISOLONE SODIUM SUCC 125 MG IJ SOLR: 125.0000 mg | Freq: Once | INTRAMUSCULAR | Status: DC | PRN

## 2019-09-02 MED ORDER — ZINC SULFATE 220 (50 ZN) MG PO CAPS
220.0000 mg | ORAL_CAPSULE | Freq: Every day | ORAL | Status: DC
  Administered 2019-09-02 – 2019-09-07 (×6): 220 mg via ORAL
  Filled 2019-09-02 (×6): qty 1

## 2019-09-02 MED ORDER — EPINEPHRINE 0.3 MG/0.3ML IJ SOAJ: 0.3000 mg | Freq: Once | INTRAMUSCULAR | Status: DC | PRN

## 2019-09-02 MED ORDER — SODIUM CHLORIDE 0.9 % IV SOLN: INTRAVENOUS | Status: DC | PRN

## 2019-09-02 MED ORDER — ACETAMINOPHEN 325 MG PO TABS
650.0000 mg | ORAL_TABLET | Freq: Four times a day (QID) | ORAL | Status: DC | PRN
  Filled 2019-09-02: qty 2

## 2019-09-02 MED ORDER — FAMOTIDINE IN NACL 20-0.9 MG/50ML-% IV SOLN: 20.0000 mg | Freq: Once | INTRAVENOUS | Status: DC | PRN

## 2019-09-02 MED ORDER — ALBUTEROL SULFATE HFA 108 (90 BASE) MCG/ACT IN AERS: 2.0000 | INHALATION_SPRAY | Freq: Once | RESPIRATORY_TRACT | Status: DC | PRN

## 2019-09-02 MED ORDER — DIPHENHYDRAMINE HCL 50 MG/ML IJ SOLN: 50.0000 mg | Freq: Once | INTRAMUSCULAR | Status: DC | PRN

## 2019-09-02 MED ORDER — SODIUM CHLORIDE 0.9 % IV BOLUS
1000.0000 mL | Freq: Once | INTRAVENOUS | Status: AC
  Administered 2019-09-02: 1000 mL via INTRAVENOUS

## 2019-09-02 NOTE — ED Notes (Signed)
Patient was ambulated and did exercises in the room. Patient became extremely short of breath while doing exercises in the room. Pulse rate increase with ambulation to 145 and oxygen sats decrease to 91 to 93 percent on room air. Patient states that he does not feel well and actually feels worse than he did earlier.

## 2019-09-02 NOTE — ED Triage Notes (Signed)
Pt diagnosed on Tuesday with COVID. Symptoms began last Saturday. Got an infusion at [facility] today and felt worse.

## 2019-09-02 NOTE — ED Notes (Signed)
Patient given meal. 

## 2019-09-02 NOTE — Discharge Instructions (Signed)

## 2019-09-02 NOTE — H&P (Signed)
History and Physical  Luis Simpson MKL:[id number] DOB: April 02, 1985 DOA: 09/02/2019  Referring physician: Vanetta Mulders MD PCP: Junie Spencer, FNP  Patient coming from: Home  Chief Complaint: Shortness of breath  HPI: Luis Simpson is a 34 y.o. male with medical history significant for COVID-19 virus infection, GERD, obesity who presents to the emergency department due to shortness of breath.  Patient presented to an urgent care about 4 days ago with complaint of generalized body aches, fever, chills, sore throat, loss of taste and smell for 2 to 3 days prior to presentation to the urgent care, he was treated with Tessalon, prednisone, Flonase and cetirizine and patient was screened for COVID-19 which returned positive and was asked to self isolate himself at home for about 10 days from symptom onset and for greater than 24 hours after symptom resolution.  He was at [facility] today and had infusion therapy with a monoclonal antibody and a complaint of increased shortness of breath which worsens on ambulation, patient states that he has been taking shallow breaths for several days due to midsternal and nonradiating chest pain (worsens on taking deep breath).  Patient has not had any of the COVID-19 viral vaccines.  He denies nausea, vomiting or diarrhea.  ED Course:  In the emergency department, he was tachycardic, tachypneic and was transitorily febrile but this improved with Tylenol.  However, patient continued to maintain O2 sat from 91% to 99% on room air.  It was noted that HR increases so 140s and RR increases to 30s on ambulation and was with increased work of breathing despite patient still been able to maintain normal O2 sat.  Work-up in the ED showed normal CBC, CMP showed mild hyponatremia hypokalemia, hypocalcemia, mild transaminitis.  CRP 6.5, LDH 239, D-dimer 0.66, fibrinogen 555, lactic acid was normal, procalcitonin was normal.  Chest x-ray showed pulmonary hyperinflation with  superimposed left basilar atelectasis or infiltrate noted.  CT angiography of chest showed no acute pulmonary embolus, but showed widespread bilateral COVID-19 pneumonia.  Hospitalist was asked to admit patient for further evaluation and management.  Review of Systems: Constitutional: Positive for chills and fever.  HENT: Negative for ear pain and sore throat.   Eyes: Negative for pain and visual disturbance.  Respiratory: Positive for shortness of breath and mild cough (nonproductive)  Cardiovascular: Negative for chest pain and palpitations.  Gastrointestinal: Negative for abdominal pain and vomiting.  Endocrine: Negative for polyphagia and polyuria.  Genitourinary: Negative for decreased urine volume, dysuria, enuresis Musculoskeletal: Negative for arthralgias and back pain.  Skin: Negative for color change and rash.  Allergic/Immunologic: Negative for immunocompromised state.  Neurological: Negative for tremors, syncope, speech difficulty, weakness, light-headedness and headaches.  Hematological: Does not bruise/bleed easily.  All other systems reviewed and are negative   Past Medical History:  Diagnosis Date  . Constipation    Past Surgical History:  Procedure Laterality Date  . NASAL SEPTOPLASTY W/ TURBINOPLASTY Bilateral 06/08/2019   Procedure: NASAL SEPTOPLASTY WITH  BILATERAL TURBINATE REDUCTION;  Surgeon: Newman Pies, MD;  Location:  SURGERY CENTER;  Service: ENT;  Laterality: Bilateral;  . WISDOM TOOTH EXTRACTION      Social History:  reports that he quit smoking about 4 years ago. His smoking use included cigarettes. He has a 15.00 pack-year smoking history. He has never used smokeless tobacco. He reports that he does not drink alcohol and does not use drugs.   No Known Allergies  Family History  Problem Relation Age of Onset  .  Diverticulitis Father   . Colon cancer Neg Hx   . Rectal cancer Neg Hx   . Stomach cancer Neg Hx     Prior to Admission  medications   Medication Sig Start Date End Date Taking? Authorizing Provider  benzonatate (TESSALON) 100 MG capsule Take 1 capsule (100 mg total) by mouth every 8 (eight) hours. 08/30/19  Yes Avegno, Zachery Dakins, FNP  cetirizine (ZYRTEC ALLERGY) 10 MG tablet Take 1 tablet (10 mg total) by mouth daily. 08/30/19  Yes Avegno, Zachery Dakins, FNP  cholecalciferol (VITAMIN D3) 25 MCG (1000 UNIT) tablet Take 2,000 Units by mouth daily.   Yes [provider]  fluticasone (FLONASE) 50 MCG/ACT nasal spray Place 1 spray into both nostrils daily for 14 days. 08/30/19 09/13/19 Yes Avegno, Zachery Dakins, FNP  ibuprofen (ADVIL) 200 MG tablet Take 200 mg by mouth every 6 (six) hours as needed for fever or moderate pain.   Yes [provider]  Multiple Vitamin (MULTIVITAMIN) capsule Take 1 capsule by mouth daily.   Yes [provider]  predniSONE (DELTASONE) 10 MG tablet Take 2 tablets (20 mg total) by mouth daily for 5 days. 08/30/19 09/04/19 Yes Avegno, Zachery Dakins, FNP    Physical Exam: BP (!) 143/91   Pulse (!) 110   Temp 98.7 F (37.1 C) (Oral)   Resp (!) 27   Ht 5\' 7"  (1.702 m)   Wt 106.6 kg   SpO2 93%   BMI 36.81 kg/m   . General: 34 y.o. year-old male well developed well nourished in no acute distress.  Alert and oriented x3. 20 HEENT: NCAT, EOMI, PERRLA . Neck: Supple, trachea medial . Cardiovascular: Tachycardic.  Regular rate and rhythm with no rubs or gallops.  No thyromegaly or JVD noted.  No lower extremity edema. 2/4 pulses in all 4 extremities. Marland Kitchen Respiratory: Tachypnea.  Clear to auscultation with no wheezes or rales. Good inspiratory effort. . Abdomen: Soft nontender nondistended with normal bowel sounds x4 quadrants. . Muskuloskeletal: No cyanosis, clubbing or edema noted bilaterally . Neuro: CN II-XII intact, strength, sensation, reflexes . Skin: No ulcerative lesions noted or rashes . Psychiatry: Judgement and insight appear normal. Mood is appropriate for condition  and setting          Labs on Admission:  Basic Metabolic Panel: Recent Labs  Lab 09/02/19 2153  NA 134*  K 3.1*  CL 105  CO2 19*  GLUCOSE 109*  BUN 12  CREATININE 0.87  CALCIUM 7.8*   Liver Function Tests: Recent Labs  Lab 09/02/19 2153  AST 46*  ALT 54*  ALKPHOS 55  BILITOT 0.4  PROT 6.9  ALBUMIN 3.6   No results for input(s): LIPASE, AMYLASE in the last 168 hours. No results for input(s): AMMONIA in the last 168 hours. CBC: Recent Labs  Lab 09/02/19 2153  WBC 6.5  NEUTROABS 5.3  HGB 13.2  HCT 40.3  MCV 83.4  PLT 166   Cardiac Enzymes: No results for input(s): CKTOTAL, CKMB, CKMBINDEX, TROPONINI in the last 168 hours.  BNP (last 3 results) No results for input(s): BNP in the last 8760 hours.  ProBNP (last 3 results) No results for input(s): PROBNP in the last 8760 hours.  CBG: No results for input(s): GLUCAP in the last 168 hours.  Radiological Exams on Admission: DG Chest Port 1 View  Result Date: 09/02/2019 CLINICAL DATA:  Dyspnea, COVID positive EXAM: PORTABLE CHEST 1 VIEW COMPARISON:  None. FINDINGS: Lung volumes are small. Mild left basilar atelectasis or  infiltrate. No pneumothorax or pleural effusion. Cardiac size within normal limits. No acute bone abnormality. IMPRESSION: Pulmonary hypoinflation. Superimposed left basilar atelectasis or infiltrate. Electronically Signed   By: Helyn Numbers MD   On: 09/02/2019 19:01    EKG: I independently viewed the EKG done and my findings are as followed: Sinus tachycardia at a rate of 100 bpm  Assessment/Plan Present on Admission: . COVID-19 virus infection . Obesity (BMI 30.0-34.9)  Principal Problem:   COVID-19 virus infection Active Problems:   Obesity (BMI 30.0-34.9)   Hyponatremia   Hypokalemia   Hypocalcemia   Transaminitis  COVID-19 virus infection Patient tested positive for SARS-CoV-2, NAA He received Covid monoclonal antibody (casirivimab/imdevimab) yesterday and now presents with  increased shortness of breath with tachycardia, tachypnea. Chest x-ray showed pulmonary hypoinflation with superimposed left basilar atelectasis or infiltrate  CT angiography of chest showed widespread bilateral COVID-19 pneumonia.  Patient continued to maintain normal O2 sats on room air, consider supplemental oxygen via [place] to maintain O2 sats >92% if patient becomes hypoxic. No current indication for Decadron and remdesivir at this time Continue vit. C 500 mg and Zn 220 mg daily Continue mucolytics and antitussives Continue incentive spirometry and flutter valve CRP 6.5, LDH 239, D-dimer 0.66, fibrinogen 555, lactic acid was normal, procalcitonin was normal. Continue to obtain daily COVID inflammatory markers and adjust accordingly Continue airborne isolation. Encourage proning as tolerated CT angiography of chest was done to rule out PE and was negative.   NOTE: Several hours after patient has been in the ED (AP), he became hypoxic with O2 sat dropping to 88% per nurses report and patient was brought with supplemental oxygen at 2 LPM with improved O2 sat to 93-95%. CT angiography of chest showed widespread bilateral COVID-19 pneumonia as indicated above Patient will be started on COVID-19 virus pneumonia treatment regimen Continue albuterol Q6H Continue IV Decadron 6 mg daily Remdesivir per pharmacy protocol Continue vit. C 500 mg and Zn 220 mg daily Continue mucolytics and antitussives Continue to obtain daily COVID inflammatory markers and adjust accordingly Continue supplemental oxygen via [place] to obtain SPO2 > 93%. Continue airborne isolation. Encourage proning as tolerated Patient will be admitted to stepdown unit  Electrolyte abnormalities (Hyponatremia, Hypokalemia, hypocalcemia) Na 134, possibly due to COVID-19 infection K+ 3.1, Ca 8.2 (corrected), these will be replenished  Transaminitis AST 46, ALT 54 Continue to monitor liver enzymes  Obesity (BMI 36.81) Patient was  counseled about the cardiovascular and metabolic risk of morbid obesity. Patient was counseled for diet control, exercise regimen and weight loss.    DVT prophylaxis: Lovenox  Code Status: Full code  Family Communication: None at bedside  Disposition Plan:  Patient is from:                        home Anticipated DC to:                   home Anticipated DC date:              24hours Anticipated DC barriers:        Patient was not stable to discharge at this time  Consults called: None  Admission status: Observation    Frankey Shown MD [facility]  If 7PM-7AM, please contact night-coverage [email or web address]  09/03/2019, 12:30 AM

## 2019-09-02 NOTE — Progress Notes (Signed)
  Diagnosis: COVID-19  Physician: Dr. Patrick Wright  Procedure: Covid Infusion Clinic Med: casirivimab\imdevimab infusion - Provided patient with casirivimab\imdevimab fact sheet for patients, parents and caregivers prior to infusion.  Complications: No immediate complications noted.  Discharge: Discharged home   Luis Simpson 09/02/2019  

## 2019-09-02 NOTE — ED Provider Notes (Addendum)
[facility] EMERGENCY DEPARTMENT Provider Note   CSN: [id number] Arrival date & time: 09/02/19  1659     History Chief Complaint  Patient presents with  . Shortness of Breath    COVID +    Luis Simpson is a 34 y.o. male.  Patient with the diagnosis of body aches chills loss of smell sore throat shortness of breath symptoms started on Saturday had a positive Covid test at that time.  Patient was at [facility] long today and had infusion therapy with a monoclonal antibody.  Stating he is getting very short of breath cannot hardly walk far at all.  Patient did not have any of the vaccines.  Denies any nausea vomiting or diarrhea.        Past Medical History:  Diagnosis Date  . Constipation     Patient Active Problem List   Diagnosis Date Noted  . COVID-19 virus infection 09/02/2019  . Left knee sprain 02/26/2019  . 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 Hypertriglyceridemia 08/02/2014  . Gastroesophageal reflux disease 04/15/2014  . Obesity (BMI 30.0-34.9) 04/15/2014  . Paresthesia of both hands 04/15/2014  . Tobacco use 04/15/2014    Past Surgical History:  Procedure Laterality Date  . NASAL SEPTOPLASTY W/ TURBINOPLASTY Bilateral 06/08/2019   Procedure: NASAL SEPTOPLASTY WITH  BILATERAL TURBINATE REDUCTION;  Surgeon: Newman Pies, MD;  Location: [facility];  Service: ENT;  Laterality: Bilateral;  . WISDOM TOOTH EXTRACTION         Family History  Problem Relation Age of Onset  . Diverticulitis Father   . Colon cancer Neg Hx   . Rectal cancer Neg Hx   . Stomach cancer Neg Hx     Social History   Tobacco Use  . Smoking status: Former Smoker    Packs/day: 1.00    Years: 15.00    Pack years: 15.00    Types: Cigarettes    Quit date: 01/20/2015    Years since quitting: 4.6  . Smokeless tobacco: Never Used  Vaping Use  . Vaping Use: Never used  Substance Use Topics  . Alcohol use: No    Comment: rarely  . Drug use: No    Home  Medications Prior to Admission medications   Medication Sig Start Date End Date Taking? Authorizing Provider  benzonatate (TESSALON) 100 MG capsule Take 1 capsule (100 mg total) by mouth every 8 (eight) hours. 08/30/19  Yes Avegno, Zachery Dakins, FNP  cetirizine (ZYRTEC ALLERGY) 10 MG tablet Take 1 tablet (10 mg total) by mouth daily. 08/30/19  Yes Avegno, Zachery Dakins, FNP  cholecalciferol (VITAMIN D3) 25 MCG (1000 UNIT) tablet Take 2,000 Units by mouth daily.   Yes [provider]  fluticasone (FLONASE) 50 MCG/ACT nasal spray Place 1 spray into both nostrils daily for 14 days. 08/30/19 09/13/19 Yes Avegno, Zachery Dakins, FNP  ibuprofen (ADVIL) 200 MG tablet Take 200 mg by mouth every 6 (six) hours as needed for fever or moderate pain.   Yes [provider]  Multiple Vitamin (MULTIVITAMIN) capsule Take 1 capsule by mouth daily.   Yes [provider]  predniSONE (DELTASONE) 10 MG tablet Take 2 tablets (20 mg total) by mouth daily for 5 days. 08/30/19 09/04/19 Yes Avegno, Zachery Dakins, FNP    Allergies    Patient has no known allergies.  Review of Systems   Review of Systems  Constitutional: Positive for chills, fatigue and fever.  HENT: Negative for congestion, rhinorrhea and sore throat.   Eyes:  Negative for visual disturbance.  Respiratory: Positive for shortness of breath. Negative for cough.   Cardiovascular: Negative for chest pain and leg swelling.  Gastrointestinal: Negative for abdominal pain, diarrhea, nausea and vomiting.  Genitourinary: Negative for dysuria.  Musculoskeletal: Positive for myalgias. Negative for back pain and neck pain.  Skin: Negative for rash.  Neurological: Negative for dizziness, light-headedness and headaches.  Hematological: Does not bruise/bleed easily.  Psychiatric/Behavioral: Negative for confusion.    Physical Exam Updated Vital Signs BP 109/81   Pulse (!) 107   Temp 99.3 F (37.4 C) (Oral)   Resp (!) 39   Ht 1.702 m (5\' 7" )   Wt  106.6 kg   SpO2 95%   BMI 36.81 kg/m   Physical Exam Vitals and nursing note reviewed.  Constitutional:      Appearance: Normal appearance. He is well-developed.  HENT:     Head: Normocephalic and atraumatic.  Eyes:     Extraocular Movements: Extraocular movements intact.     Conjunctiva/sclera: Conjunctivae normal.     Pupils: Pupils are equal, round, and reactive to light.  Cardiovascular:     Rate and Rhythm: Regular rhythm. Tachycardia present.     Heart sounds: No murmur heard.   Pulmonary:     Effort: Respiratory distress present.     Breath sounds: Normal breath sounds.  Abdominal:     Palpations: Abdomen is soft.     Tenderness: There is no abdominal tenderness.  Musculoskeletal:        General: No swelling. Normal range of motion.     Cervical back: Neck supple.  Skin:    General: Skin is warm and dry.     Capillary Refill: Capillary refill takes less than 2 seconds.  Neurological:     General: No focal deficit present.     Mental Status: He is alert and oriented to person, place, and time.     ED Results / Procedures / Treatments   Labs (all labs ordered are listed, but only abnormal results are displayed) Labs Reviewed  CULTURE, BLOOD (ROUTINE X 2)  CULTURE, BLOOD (ROUTINE X 2)  LACTIC ACID, PLASMA  LACTIC ACID, PLASMA  CBC WITH DIFFERENTIAL/PLATELET  COMPREHENSIVE METABOLIC PANEL  D-DIMER, QUANTITATIVE (NOT AT [facility])  PROCALCITONIN  LACTATE DEHYDROGENASE  FERRITIN  TRIGLYCERIDES  FIBRINOGEN  C-REACTIVE PROTEIN  HIV ANTIBODY (ROUTINE TESTING W REFLEX)  CBC WITH DIFFERENTIAL/PLATELET  COMPREHENSIVE METABOLIC PANEL  C-REACTIVE PROTEIN  D-DIMER, QUANTITATIVE (NOT AT [facility])  FERRITIN  MAGNESIUM  PHOSPHORUS  ABO/RH    EKG None  Radiology DG Chest Port 1 View  Result Date: 09/02/2019 CLINICAL DATA:  Dyspnea, COVID positive EXAM: PORTABLE CHEST 1 VIEW COMPARISON:  None. FINDINGS: Lung volumes are small. Mild left basilar atelectasis or  infiltrate. No pneumothorax or pleural effusion. Cardiac size within normal limits. No acute bone abnormality. IMPRESSION: Pulmonary hypoinflation. Superimposed left basilar atelectasis or infiltrate. Electronically Signed   By: 09/04/2019 MD   On: 09/02/2019 19:01    Procedures Procedures (including critical care time)  CRITICAL CARE Performed by: 09/04/2019 Total critical care time: 35 minutes Critical care time was exclusive of separately billable procedures and treating other patients. Critical care was necessary to treat or prevent imminent or life-threatening deterioration. Critical care was time spent personally by me on the following activities: development of treatment plan with patient and/or surrogate as well as nursing, discussions with consultants, evaluation of patient's response to treatment, examination of patient, obtaining history from patient or surrogate, ordering  and performing treatments and interventions, ordering and review of laboratory studies, ordering and review of radiographic studies, pulse oximetry and re-evaluation of patient's condition.   Medications Ordered in ED Medications  enoxaparin (LOVENOX) injection 40 mg (has no administration in time range)  guaiFENesin-dextromethorphan (ROBITUSSIN DM) 100-10 MG/5ML syrup 10 mL (has no administration in time range)  chlorpheniramine-HYDROcodone (TUSSIONEX) 10-8 MG/5ML suspension 5 mL (has no administration in time range)  ascorbic acid (VITAMIN C) tablet 500 mg (has no administration in time range)  zinc sulfate capsule 220 mg (has no administration in time range)  acetaminophen (TYLENOL) tablet 650 mg (has no administration in time range)  acetaminophen (TYLENOL) tablet 650 mg (650 mg Oral Given 09/02/19 1945)    ED Course  I have reviewed the triage vital signs and the nursing notes.  Pertinent labs & imaging results that were available during my care of the patient were reviewed by me and considered  in my medical decision making (see chart for details).    MDM Rules/Calculators/A&P                         Patient with positive Covid.  Chest x-ray shows early infiltrate.  Patient even with exercise not hypoxic oxygen sats go down to about 91%.  But he gets very tachypneic and very tachycardic heart rate: Up to 145 respiratory rate going up to 40.  Did have a documented fever here to 101.  Patient given Tylenol for that.  Discussed with hospitalist they are going to go ahead and admit him to [facility] and monitor him.  Covid labs ordered based on this.  Patient had positive Covid test on July 12.  Final Clinical Impression(s) / ED Diagnoses Final diagnoses:  COVID-19 virus infection  SOB (shortness of breath)    Rx / DC Orders ED Discharge Orders    None       Vanetta Mulders, MD 09/02/19 2212    Vanetta Mulders, MD 09/02/19 2213

## 2019-09-03 ENCOUNTER — Observation Stay (HOSPITAL_COMMUNITY): Payer: BLUE CROSS/BLUE SHIELD

## 2019-09-03 ENCOUNTER — Other Ambulatory Visit: Payer: Self-pay

## 2019-09-03 DIAGNOSIS — Z6839 Body mass index (BMI) 39.0-39.9, adult: Secondary | ICD-10-CM | POA: Diagnosis not present

## 2019-09-03 DIAGNOSIS — R7401 Elevation of levels of liver transaminase levels: Secondary | ICD-10-CM

## 2019-09-03 DIAGNOSIS — Z87891 Personal history of nicotine dependence: Secondary | ICD-10-CM | POA: Diagnosis not present

## 2019-09-03 DIAGNOSIS — R439 Unspecified disturbances of smell and taste: Secondary | ICD-10-CM | POA: Diagnosis present

## 2019-09-03 DIAGNOSIS — J1282 Pneumonia due to coronavirus disease 2019: Secondary | ICD-10-CM | POA: Diagnosis present

## 2019-09-03 DIAGNOSIS — E871 Hypo-osmolality and hyponatremia: Secondary | ICD-10-CM

## 2019-09-03 DIAGNOSIS — U071 COVID-19: Secondary | ICD-10-CM | POA: Diagnosis present

## 2019-09-03 DIAGNOSIS — I10 Essential (primary) hypertension: Secondary | ICD-10-CM | POA: Diagnosis present

## 2019-09-03 DIAGNOSIS — R739 Hyperglycemia, unspecified: Secondary | ICD-10-CM | POA: Diagnosis present

## 2019-09-03 DIAGNOSIS — K219 Gastro-esophageal reflux disease without esophagitis: Secondary | ICD-10-CM | POA: Diagnosis present

## 2019-09-03 DIAGNOSIS — Z79899 Other long term (current) drug therapy: Secondary | ICD-10-CM | POA: Diagnosis not present

## 2019-09-03 DIAGNOSIS — R0602 Shortness of breath: Secondary | ICD-10-CM | POA: Diagnosis present

## 2019-09-03 DIAGNOSIS — E876 Hypokalemia: Secondary | ICD-10-CM

## 2019-09-03 DIAGNOSIS — E669 Obesity, unspecified: Secondary | ICD-10-CM | POA: Diagnosis present

## 2019-09-03 DIAGNOSIS — J9601 Acute respiratory failure with hypoxia: Secondary | ICD-10-CM | POA: Diagnosis present

## 2019-09-03 LAB — MAGNESIUM: Magnesium: 1.9 mg/dL (ref 1.7–2.4)

## 2019-09-03 LAB — COMPREHENSIVE METABOLIC PANEL
ALT: 54 U/L — ABNORMAL HIGH (ref 0–44)
AST: 47 U/L — ABNORMAL HIGH (ref 15–41)
Albumin: 3.6 g/dL (ref 3.5–5.0)
Alkaline Phosphatase: 57 U/L (ref 38–126)
Anion gap: 9 (ref 5–15)
BUN: 10 mg/dL (ref 6–20)
CO2: 22 mmol/L (ref 22–32)
Calcium: 8.4 mg/dL — ABNORMAL LOW (ref 8.9–10.3)
Chloride: 103 mmol/L (ref 98–111)
Creatinine, Ser: 0.84 mg/dL (ref 0.61–1.24)
GFR calc Af Amer: >60 mL/min (ref >60)
GFR calc non Af Amer: >60 mL/min (ref >60)
Glucose, Bld: 126 mg/dL — ABNORMAL HIGH (ref 70–99)
Potassium: 3.6 mmol/L (ref 3.5–5.1)
Sodium: 134 mmol/L — ABNORMAL LOW (ref 135–145)
Total Bilirubin: 0.3 mg/dL (ref 0.3–1.2)
Total Protein: 7.1 g/dL (ref 6.5–8.1)

## 2019-09-03 LAB — CBC WITH DIFFERENTIAL/PLATELET
Abs Immature Granulocytes: 0.02 10*3/uL (ref 0.00–0.07)
Basophils Absolute: 0 10*3/uL (ref 0.0–0.1)
Basophils Relative: 0 %
Eosinophils Absolute: 0 10*3/uL (ref 0.0–0.5)
Eosinophils Relative: 0 %
HCT: 40.6 % (ref 39.0–52.0)
Hemoglobin: 13.4 g/dL (ref 13.0–17.0)
Immature Granulocytes: 0 %
Lymphocytes Relative: 14 %
Lymphs Abs: 0.9 10*3/uL (ref 0.7–4.0)
MCH: 27.5 pg (ref 26.0–34.0)
MCHC: 33 g/dL (ref 30.0–36.0)
MCV: 83.4 fL (ref 80.0–100.0)
Monocytes Absolute: 0.2 10*3/uL (ref 0.1–1.0)
Monocytes Relative: 4 %
Neutro Abs: 5.3 10*3/uL (ref 1.7–7.7)
Neutrophils Relative %: 82 %
Platelets: 168 10*3/uL (ref 150–400)
RBC: 4.87 MIL/uL (ref 4.22–5.81)
RDW: 13.1 % (ref 11.5–15.5)
WBC: 6.4 10*3/uL (ref 4.0–10.5)
nRBC: 0 % (ref 0.0–0.2)

## 2019-09-03 LAB — D-DIMER, QUANTITATIVE: D-Dimer, Quant: 0.64 ug/mL-FEU — ABNORMAL HIGH (ref 0.00–0.50)

## 2019-09-03 LAB — LACTIC ACID, PLASMA: Lactic Acid, Venous: 1 mmol/L (ref 0.5–1.9)

## 2019-09-03 LAB — CULTURE, GROUP A STREP (THRC)

## 2019-09-03 LAB — MRSA PCR SCREENING: MRSA by PCR: NEGATIVE

## 2019-09-03 LAB — C-REACTIVE PROTEIN: CRP: 11.7 mg/dL — ABNORMAL HIGH (ref <1.0)

## 2019-09-03 LAB — GLUCOSE, CAPILLARY: Glucose-Capillary: 171 mg/dL — ABNORMAL HIGH (ref 70–99)

## 2019-09-03 LAB — FERRITIN: Ferritin: 185 ng/mL (ref 24–336)

## 2019-09-03 LAB — HIV ANTIBODY (ROUTINE TESTING W REFLEX): HIV Screen 4th Generation wRfx: NONREACTIVE

## 2019-09-03 LAB — PHOSPHORUS: Phosphorus: 3 mg/dL (ref 2.5–4.6)

## 2019-09-03 MED ORDER — CHLORHEXIDINE GLUCONATE CLOTH 2 % EX PADS
6.0000 | MEDICATED_PAD | Freq: Every day | CUTANEOUS | Status: DC
  Administered 2019-09-03 – 2019-09-06 (×4): 6 via TOPICAL

## 2019-09-03 MED ORDER — ORAL CARE MOUTH RINSE
15.0000 mL | Freq: Two times a day (BID) | OROMUCOSAL | Status: DC
  Administered 2019-09-03 – 2019-09-06 (×6): 15 mL via OROMUCOSAL

## 2019-09-03 MED ORDER — SODIUM CHLORIDE 0.9 % IV SOLN
100.0000 mg | Freq: Every day | INTRAVENOUS | Status: AC
  Administered 2019-09-04 – 2019-09-07 (×4): 100 mg via INTRAVENOUS
  Filled 2019-09-03: qty 20
  Filled 2019-09-03: qty 100
  Filled 2019-09-03 (×2): qty 20

## 2019-09-03 MED ORDER — AMLODIPINE BESYLATE 5 MG PO TABS
10.0000 mg | ORAL_TABLET | Freq: Every day | ORAL | Status: DC
  Administered 2019-09-04 – 2019-09-07 (×4): 10 mg via ORAL
  Filled 2019-09-03 (×4): qty 2

## 2019-09-03 MED ORDER — ALBUTEROL SULFATE HFA 108 (90 BASE) MCG/ACT IN AERS
2.0000 | INHALATION_SPRAY | Freq: Four times a day (QID) | RESPIRATORY_TRACT | Status: DC
  Administered 2019-09-03 (×2): 2 via RESPIRATORY_TRACT
  Filled 2019-09-03: qty 6.7

## 2019-09-03 MED ORDER — INSULIN ASPART 100 UNIT/ML ~~LOC~~ SOLN
0.0000 [IU] | Freq: Three times a day (TID) | SUBCUTANEOUS | Status: DC
  Administered 2019-09-03 – 2019-09-05 (×4): 1 [IU] via SUBCUTANEOUS
  Administered 2019-09-06: 3 [IU] via SUBCUTANEOUS

## 2019-09-03 MED ORDER — ASCORBIC ACID 500 MG PO TABS
1000.0000 mg | ORAL_TABLET | Freq: Two times a day (BID) | ORAL | Status: DC
  Administered 2019-09-03 – 2019-09-07 (×8): 1000 mg via ORAL
  Filled 2019-09-03 (×8): qty 2

## 2019-09-03 MED ORDER — INSULIN ASPART 100 UNIT/ML ~~LOC~~ SOLN: 0.0000 [IU] | Freq: Every day | SUBCUTANEOUS | Status: DC

## 2019-09-03 MED ORDER — AMLODIPINE BESYLATE 5 MG PO TABS
5.0000 mg | ORAL_TABLET | Freq: Every day | ORAL | Status: DC
  Administered 2019-09-03: 5 mg via ORAL
  Filled 2019-09-03: qty 1

## 2019-09-03 MED ORDER — DEXAMETHASONE SODIUM PHOSPHATE 10 MG/ML IJ SOLN
6.0000 mg | Freq: Every day | INTRAMUSCULAR | Status: DC
  Administered 2019-09-03: 6 mg via INTRAVENOUS
  Filled 2019-09-03: qty 1

## 2019-09-03 MED ORDER — IOHEXOL 350 MG/ML SOLN
100.0000 mL | Freq: Once | INTRAVENOUS | Status: AC | PRN
  Administered 2019-09-03: 100 mL via INTRAVENOUS

## 2019-09-03 MED ORDER — ALUM & MAG HYDROXIDE-SIMETH 200-200-20 MG/5ML PO SUSP
30.0000 mL | ORAL | Status: DC | PRN
  Administered 2019-09-03 – 2019-09-04 (×2): 30 mL via ORAL
  Filled 2019-09-03 (×2): qty 30

## 2019-09-03 MED ORDER — METHYLPREDNISOLONE SODIUM SUCC 125 MG IJ SOLR
60.0000 mg | Freq: Two times a day (BID) | INTRAMUSCULAR | Status: DC
  Administered 2019-09-03 – 2019-09-07 (×9): 60 mg via INTRAVENOUS
  Filled 2019-09-03 (×9): qty 2

## 2019-09-03 MED ORDER — SODIUM CHLORIDE 0.9 % IV SOLN
100.0000 mg | INTRAVENOUS | Status: AC
  Administered 2019-09-03 (×2): 100 mg via INTRAVENOUS
  Filled 2019-09-03 (×4): qty 20

## 2019-09-03 MED ORDER — SODIUM CHLORIDE 0.9 % IV SOLN: 100.0000 mg | Freq: Every day | INTRAVENOUS | Status: DC

## 2019-09-03 MED ORDER — DM-GUAIFENESIN ER 30-600 MG PO TB12
1.0000 | ORAL_TABLET | Freq: Two times a day (BID) | ORAL | Status: DC
  Administered 2019-09-03 – 2019-09-07 (×10): 1 via ORAL
  Filled 2019-09-03 (×10): qty 1

## 2019-09-03 MED ORDER — POTASSIUM CHLORIDE CRYS ER 20 MEQ PO TBCR
40.0000 meq | EXTENDED_RELEASE_TABLET | Freq: Once | ORAL | Status: AC
  Administered 2019-09-03: 40 meq via ORAL
  Filled 2019-09-03: qty 2

## 2019-09-03 MED ORDER — ASCORBIC ACID 500 MG PO TABS: 500.0000 mg | ORAL_TABLET | Freq: Two times a day (BID) | ORAL | Status: DC

## 2019-09-03 MED ORDER — SODIUM CHLORIDE 0.9 % IV SOLN: 100.0000 mg | INTRAVENOUS | Status: DC

## 2019-09-03 MED ORDER — ALBUTEROL SULFATE HFA 108 (90 BASE) MCG/ACT IN AERS
2.0000 | INHALATION_SPRAY | RESPIRATORY_TRACT | Status: DC
  Administered 2019-09-03 – 2019-09-04 (×7): 2 via RESPIRATORY_TRACT

## 2019-09-03 MED ORDER — ONDANSETRON HCL 4 MG/2ML IJ SOLN
4.0000 mg | Freq: Four times a day (QID) | INTRAMUSCULAR | Status: DC | PRN
  Administered 2019-09-04: 4 mg via INTRAVENOUS
  Filled 2019-09-03: qty 2

## 2019-09-03 MED ORDER — LABETALOL HCL 5 MG/ML IV SOLN: 10.0000 mg | INTRAVENOUS | Status: DC | PRN

## 2019-09-03 MED ORDER — SODIUM CHLORIDE 0.9 % IV SOLN: 200.0000 mg | Freq: Once | INTRAVENOUS | Status: DC

## 2019-09-03 MED ORDER — CALCIUM CARBONATE-VITAMIN D 500-200 MG-UNIT PO TABS
1.0000 | ORAL_TABLET | Freq: Every day | ORAL | Status: DC
  Administered 2019-09-03 – 2019-09-07 (×5): 1 via ORAL
  Filled 2019-09-03 (×9): qty 1

## 2019-09-03 NOTE — ED Notes (Signed)
Patient taken to CT scan.

## 2019-09-03 NOTE — Progress Notes (Signed)
Patient Demographics:    Luis Simpson, is a 34 y.o. male, DOB - [birth year], GTX:[id number]  Admit date - 09/02/2019   Admitting Physician Frankey Shown, DO  Outpatient Primary MD for the patient is Junie Spencer, FNP  LOS - 0   Chief Complaint  Patient presents with  . Shortness of Breath    COVID +        Subjective:    Luis Simpson today has fevers/chills/malaise,   No chest pain,   -Patient has nausea, fatigue, dyspnea on exertion, shortness of breath,  --Hypoxia persist, requiring up to 4 L of oxygen at rest -Cough persist -Updated patient's wife by phone-  Had 3 Loose stools, had nausea, no frank emesis  Assessment  & Plan :    Principal Problem:   COVID-19 virus infection Active Problems:   Obesity (BMI 30.0-34.9)   Hyponatremia   Hypokalemia   Hypocalcemia   Transaminitis   Pneumonia due to COVID-19 virus  Brief Summary:- 34 y.o. male reformed smoker with past medical history relevant for GERD, obesity, and who is unvaccinated against COVID-19 infection presents with worsening shortness of breath and found to be hypoxic in the setting of positive COVID-19 test inpatient and other family members as well as CTA chest consistent with bilateral Covid pneumonia (tested positive on 08/30/2019) -Pt received casirivimab\imdevimab infusion as outpatient on 09/02/2019 prior to admission  A/p 1)Acute hypoxic respiratory failure secondary to COVID-19 infection/pneumonia--- The treatment plan and use of medications  for treatment of COVID-19 infection and possible side effects were discussed with patient ----Patient verbalizes understanding and agrees to treatment protocols   --Patient is positive for COVID-19 infection, CTA chest with findings of infiltrates/opacities,  patient is hypoxic and requiring continuous supplemental oxygen---patient meets criteria for initiation of Remdesivir  AND Decadron/Steroid therapy per protocol  --Check and trend fibrinogen, CRP, pro calcitonin, CBC, BMP, d-dimer, LDH, ferritin and LFTs --Supplemental oxygen to keep O2 sats above 93%---currently on 4 L via [place] -Follow serial chest x-rays and ABGs as indicated --- Encourage prone positioning for More than 16 hours/day in increments of 2 to 3 hours at a time if able to tolerate --Attempt to maintain euvolemic state --Zinc and vitamin C as ordered -Albuterol inhaler as needed -Received Decadron in the ED switched to IV Solu-Medrol 0.5 mg/kg every 12 - 2)Obesity-obesity confers higher risk from a COVID-19 mobility mortality standpoint -Low calorie diet, portion control and increase physical activity discussed with patient--this can be addressed after resolution of his acute infection symptoms -Body mass index is 36.29 kg/m.   3) hyperglycemia--- anticipate worsening glycemic control with steroids Use Novolog/Humalog Sliding scale insulin with Accu-Cheks/Fingersticks as ordered  4)HTN--elevated BP noted, anticipate worsening BP control with steroids ---amlodipine started,   may use IV labetalol when necessary  Every 4 hours for systolic blood pressure over 160 mmhg   Disposition/Need for in-Hospital Stay- patient unable to be discharged at this time due to --- hypoxic respiratory failure in the setting of bilateral Covid pneumonia requiring oxygen supplementation, IV steroids and IV remdesivir  Status is: Inpatient  Remains inpatient appropriate because:hypoxic respiratory failure in the setting of bilateral Covid pneumonia requiring oxygen supplementation, IV steroids and IV remdesivir   Disposition: The patient is from: Home  Anticipated d/c is to: Home              Anticipated d/c date is: 2 days              Patient currently is not medically stable to d/c. Barriers: Not Clinically Stable- -hypoxic respiratory failure in the setting of bilateral Covid pneumonia requiring  oxygen supplementation, IV steroids and IV remdesivir  Code Status : Full code  Family Communication:    patient is alert, awake and coherent  Consults  :  na  DVT Prophylaxis  :  Lovenox   - SCDs   Lab Results  Component Value Date   PLT 168 09/03/2019    Inpatient Medications  Scheduled Meds: . albuterol  2 puff Inhalation Q4H  . [START ON 09/04/2019] amLODipine  10 mg Oral Daily  . vitamin C  1,000 mg Oral BID  . calcium-vitamin D  1 tablet Oral Q breakfast  . Chlorhexidine Gluconate Cloth  6 each Topical Daily  . dextromethorphan-guaiFENesin  1 tablet Oral BID  . enoxaparin (LOVENOX) injection  40 mg Subcutaneous Q24H  . insulin aspart  0-5 Units Subcutaneous QHS  . insulin aspart  0-6 Units Subcutaneous TID WC  . lidocaine  1 patch Transdermal Q24H  . mouth rinse  15 mL Mouth Rinse BID  . methylPREDNISolone (SOLU-MEDROL) injection  60 mg Intravenous Q12H  . zinc sulfate  220 mg Oral Daily   Continuous Infusions: . [START ON 09/04/2019] remdesivir 100 mg in NS 100 mL     PRN Meds:.acetaminophen, alum & mag hydroxide-simeth, chlorpheniramine-HYDROcodone, guaiFENesin-dextromethorphan, labetalol, ondansetron (ZOFRAN) IV    Anti-infectives (From admission, onward)   Start     Dose/Rate Route Frequency Ordered Stop   09/04/19 1000  remdesivir 100 mg in sodium chloride 0.9 % 100 mL IVPB  Status:  Discontinued       "Followed by" Linked Group Details   100 mg 200 mL/hr over 30 Minutes Intravenous Daily 09/03/19 0536 09/03/19 0537   09/04/19 1000  remdesivir 100 mg in sodium chloride 0.9 % 100 mL IVPB     Discontinue    "Followed by" Linked Group Details   100 mg 200 mL/hr over 30 Minutes Intravenous Daily 09/03/19 0539 09/08/19 0959   09/03/19 0545  remdesivir 100 mg in sodium chloride 0.9 % 100 mL IVPB  Status:  Discontinued        100 mg 200 mL/hr over 30 Minutes Intravenous Every 30 min 09/03/19 0539 09/03/19 0540   09/03/19 0545  remdesivir 100 mg in sodium chloride  0.9 % 100 mL IVPB       "Followed by" Linked Group Details   100 mg 200 mL/hr over 30 Minutes Intravenous Every 30 min 09/03/19 0539 09/03/19 0729   09/03/19 0530  remdesivir 200 mg in sodium chloride 0.9% 250 mL IVPB  Status:  Discontinued       "Followed by" Linked Group Details   200 mg 580 mL/hr over 30 Minutes Intravenous Once 09/03/19 0536 09/03/19 0537        Objective:   Vitals:   09/03/19 1430 09/03/19 1458 09/03/19 1502 09/03/19 1722  BP: (!) 156/96  (!) 153/97 (!) 159/92  Pulse: (!) 106 97 94 (!) 102  Resp: (!) 47 (!) 36 (!) 29 (!) 25  Temp:    97.6 F (36.4 C)  TempSrc:    Oral  SpO2: 91% 93% 95% 93%  Weight:      Height:  Wt Readings from Last 3 Encounters:  09/03/19 105.1 kg  08/30/19 106.6 kg  06/19/19 106.6 kg     Intake/Output Summary (Last 24 hours) at 09/03/2019 1902 Last data filed at 09/03/2019 0900 Gross per 24 hour  Intake 320 ml  Output 2500 ml  Net -2180 ml     Physical Exam  Gen:- Awake Alert, respiratory distress, only able to speak in short sentences  HEENT:- [place].AT, No sclera icterus Nose- [place] 4L/min Neck-Supple Neck,No JVD,.  Lungs-diminished breath sounds, few scattered wheezes  CV- S1, S2 normal, regular  Abd-  +ve B.Sounds, Abd Soft, No tenderness, increased truncal adiposity Extremity/Skin:- No  edema, pedal pulses present  Psych-affect is appropriate, oriented x3 Neuro-generalized weakness, no new focal deficits, no tremors   Data Review:   Micro Results Recent Results (from the past 240 hour(s))  Novel Coronavirus, NAA ([facility])     Status: Abnormal   Collection Time: 08/30/19  7:23 PM   Specimen: Nasal Swab; Nasopharyngeal(NP) swabs in vial transport medium   Nasopharynge  Patient  Result Value Ref Range Status   SARS-CoV-2, NAA Detected (A) Not Detected Final    Comment: Patients who have a positive COVID-19 test result may now have treatment options. Treatment options are available for patients with mild to  moderate symptoms and for hospitalized patients. Visit our website at [email or web address] for resources and information. This nucleic acid amplification test was developed and its performance characteristics determined by [facility]. Nucleic acid amplification tests include RT-PCR and TMA. This test has not been FDA cleared or approved. This test has been authorized by FDA under an Emergency Use Authorization (EUA). This test is only authorized for the duration of time the declaration that circumstances exist justifying the authorization of the emergency use of in vitro diagnostic tests for detection of SARS-CoV-2 virus and/or diagnosis of COVID-19 infection under section 564(b)(1) of the Act, 21 U.S.C. 098JXB-1(Y360bbb-3(b) (1), unless the authorization is terminated or revoked sooner. When diagnostic testing is negativ e, the possibility of a false negative result should be considered in the context of a patient's recent exposures and the presence of clinical signs and symptoms consistent with COVID-19. An individual without symptoms of COVID-19 and who is not shedding SARS-CoV-2 virus would expect to have a negative (not detected) result in this assay.   SARS-COV-2, NAA 2 DAY TAT     Status: None   Collection Time: 08/30/19  7:23 PM   Nasopharynge  Patient  Result Value Ref Range Status   SARS-CoV-2, NAA 2 DAY TAT Performed  Final  Culture, group A strep     Status: None   Collection Time: 08/30/19  7:32 PM   Specimen: Throat  Result Value Ref Range Status   Specimen Description   Final    THROAT Performed at [facility], [address] [place], [place] [address]    Special Requests   Final    NONE Performed at [facility], [address] [place], [place] [address]    Culture   Final    NO GROUP A STREP (S.PYOGENES) ISOLATED Performed at [facility], [address] [place], [place] [address]    Report Status 09/03/2019 FINAL  Final  Blood Culture  (routine x 2)     Status: None (Preliminary result)   Collection Time: 09/02/19  9:51 PM   Specimen: BLOOD  Result Value Ref Range Status   Specimen Description BLOOD RIGHT ANTECUBITAL  Final   Special Requests   Final  BOTTLES DRAWN AEROBIC AND ANAEROBIC Blood Culture adequate volume   Culture   Final    NO GROWTH < 12 HOURS Performed at [facility], [address], [place], [place] [address]    Report Status PENDING  Incomplete  Blood Culture (routine x 2)     Status: None (Preliminary result)   Collection Time: 09/02/19  9:53 PM   Specimen: BLOOD RIGHT HAND  Result Value Ref Range Status   Specimen Description BLOOD RIGHT HAND  Final   Special Requests AEROBIC BOTTLE ONLY Blood Culture adequate volume  Final   Culture   Final    NO GROWTH < 12 HOURS Performed at [facility], [address] [place], [place] [address]    Report Status PENDING  Incomplete  MRSA PCR Screening     Status: None   Collection Time: 09/03/19  8:32 AM   Specimen: Nasal Mucosa; Nasopharyngeal  Result Value Ref Range Status   MRSA by PCR NEGATIVE NEGATIVE Final    Comment:        The GeneXpert MRSA Assay (FDA approved for NASAL specimens only), is one component of a comprehensive MRSA colonization surveillance program. It is not intended to diagnose MRSA infection nor to guide or monitor treatment for MRSA infections. Performed at [facility], [address], [place], [place] [address]    Radiology Reports CT ANGIO CHEST PE W OR WO CONTRAST  Result Date: 09/03/2019 CLINICAL DATA:  34 year old male recently positive for COVID-19. Progressive shortness of breath today. EXAM: CT ANGIOGRAPHY CHEST WITH CONTRAST TECHNIQUE: Multidetector CT imaging of the chest was performed using the standard protocol during bolus administration of intravenous contrast. Multiplanar CT image reconstructions and MIPs were obtained to evaluate the vascular anatomy. CONTRAST:  OMNIPAQUE IOHEXOL 350 MG/ML SOLN  COMPARISON:  Portable chest 09/02/2019 FINDINGS: Cardiovascular: Adequate contrast bolus timing in the pulmonary arterial tree. Respiratory motion. No central or hilar pulmonary embolus. The upper and middle lobe pulmonary artery branches are fairly well delineated and appear normal. Lower lobe branch detail is more degraded by motion. Cardiac size at the upper limits of normal. No pericardial effusion. Negative visible aorta. Mediastinum/Nodes: Negative.  No lymphadenopathy. Lungs/Pleura: Major airways are patent. Widespread bilateral peribronchial and peripheral confluent pulmonary ground-glass opacity. The left upper lobe is least affected. No pleural effusion. No areas of solid lung consolidation at this time. Upper Abdomen: Possible hepatic steatosis. Otherwise negative visible liver, spleen, pancreas, left adrenal gland, bowel. Musculoskeletal: Negative. Review of the MIP images confirms the above findings. IMPRESSION: 1. No acute pulmonary embolus identified, some lower lobe branches are degraded by motion. 2. Widespread bilateral COVID-19 pneumonia. No pleural effusion. 3. Possible hepatic steatosis. Electronically Signed   By: Odessa Fleming M.D.   On: 09/03/2019 01:41   DG Chest Port 1 View  Result Date: 09/02/2019 CLINICAL DATA:  Dyspnea, COVID positive EXAM: PORTABLE CHEST 1 VIEW COMPARISON:  None. FINDINGS: Lung volumes are small. Mild left basilar atelectasis or infiltrate. No pneumothorax or pleural effusion. Cardiac size within normal limits. No acute bone abnormality. IMPRESSION: Pulmonary hypoinflation. Superimposed left basilar atelectasis or infiltrate. Electronically Signed   By: Helyn Numbers MD   On: 09/02/2019 19:01     CBC Recent Labs  Lab 09/02/19 2153 09/03/19 0543  WBC 6.5 6.4  HGB 13.2 13.4  HCT 40.3 40.6  PLT 166 168  MCV 83.4 83.4  MCH 27.3 27.5  MCHC 32.8 33.0  RDW 12.9 13.1  LYMPHSABS 0.9 0.9  MONOABS 0.2 0.2  EOSABS 0.0 0.0  BASOSABS 0.0 0.0    Chemistries    Recent Labs  Lab 09/02/19 2153 09/03/19 0543  NA 134* 134*  K 3.1* 3.6  CL 105 103  CO2 19* 22  GLUCOSE 109* 126*  BUN 12 10  CREATININE 0.87 0.84  CALCIUM 7.8* 8.4*  MG  --  1.9  AST 46* 47*  ALT 54* 54*  ALKPHOS 55 57  BILITOT 0.4 0.3   ------------------------------------------------------------------------------------------------------------------ Recent Labs    09/02/19 2153  TRIG 91    No results found for: HGBA1C ------------------------------------------------------------------------------------------------------------------ No results for input(s): TSH, T4TOTAL, T3FREE, THYROIDAB in the last 72 hours.  Invalid input(s): FREET3 ------------------------------------------------------------------------------------------------------------------ Recent Labs    09/02/19 2153 09/03/19 0543  FERRITIN 170 185    Coagulation profile No results for input(s): INR, PROTIME in the last 168 hours.  Recent Labs    09/02/19 2153 09/03/19 0543  DDIMER 0.66* 0.64*    Cardiac Enzymes No results for input(s): CKMB, TROPONINI, MYOGLOBIN in the last 168 hours.  Invalid input(s): CK ------------------------------------------------------------------------------------------------------------------ No results found for: BNP   Shon Hale M.D on 09/03/2019 at 7:02 PM  Go to [email or web address] - for contact info  [facility] - Office  [phone]

## 2019-09-04 LAB — CBC WITH DIFFERENTIAL/PLATELET
Abs Immature Granulocytes: 0.02 10*3/uL (ref 0.00–0.07)
Basophils Absolute: 0 10*3/uL (ref 0.0–0.1)
Basophils Relative: 0 %
Eosinophils Absolute: 0 10*3/uL (ref 0.0–0.5)
Eosinophils Relative: 0 %
HCT: 44.5 % (ref 39.0–52.0)
Hemoglobin: 14.5 g/dL (ref 13.0–17.0)
Immature Granulocytes: 1 %
Lymphocytes Relative: 16 %
Lymphs Abs: 0.6 10*3/uL — ABNORMAL LOW (ref 0.7–4.0)
MCH: 26.9 pg (ref 26.0–34.0)
MCHC: 32.6 g/dL (ref 30.0–36.0)
MCV: 82.6 fL (ref 80.0–100.0)
Monocytes Absolute: 0.2 10*3/uL (ref 0.1–1.0)
Monocytes Relative: 5 %
Neutro Abs: 3 10*3/uL (ref 1.7–7.7)
Neutrophils Relative %: 78 %
Platelets: 208 10*3/uL (ref 150–400)
RBC: 5.39 MIL/uL (ref 4.22–5.81)
RDW: 12.9 % (ref 11.5–15.5)
WBC: 3.9 10*3/uL — ABNORMAL LOW (ref 4.0–10.5)
nRBC: 0 % (ref 0.0–0.2)

## 2019-09-04 LAB — PROCALCITONIN: Procalcitonin: 1.27 ng/mL

## 2019-09-04 LAB — COMPREHENSIVE METABOLIC PANEL
ALT: 55 U/L — ABNORMAL HIGH (ref 0–44)
AST: 42 U/L — ABNORMAL HIGH (ref 15–41)
Albumin: 3.7 g/dL (ref 3.5–5.0)
Alkaline Phosphatase: 54 U/L (ref 38–126)
Anion gap: 12 (ref 5–15)
BUN: 14 mg/dL (ref 6–20)
CO2: 23 mmol/L (ref 22–32)
Calcium: 8.9 mg/dL (ref 8.9–10.3)
Chloride: 102 mmol/L (ref 98–111)
Creatinine, Ser: 0.78 mg/dL (ref 0.61–1.24)
GFR calc Af Amer: >60 mL/min (ref >60)
GFR calc non Af Amer: >60 mL/min (ref >60)
Glucose, Bld: 161 mg/dL — ABNORMAL HIGH (ref 70–99)
Potassium: 3.7 mmol/L (ref 3.5–5.1)
Sodium: 137 mmol/L (ref 135–145)
Total Bilirubin: 0.5 mg/dL (ref 0.3–1.2)
Total Protein: 7.7 g/dL (ref 6.5–8.1)

## 2019-09-04 LAB — D-DIMER, QUANTITATIVE: D-Dimer, Quant: 0.62 ug/mL-FEU — ABNORMAL HIGH (ref 0.00–0.50)

## 2019-09-04 LAB — C-REACTIVE PROTEIN: CRP: 11.3 mg/dL — ABNORMAL HIGH (ref <1.0)

## 2019-09-04 LAB — FERRITIN: Ferritin: 261 ng/mL (ref 24–336)

## 2019-09-04 LAB — PHOSPHORUS: Phosphorus: 4.1 mg/dL (ref 2.5–4.6)

## 2019-09-04 LAB — MAGNESIUM: Magnesium: 2.3 mg/dL (ref 1.7–2.4)

## 2019-09-04 MED ORDER — ALBUTEROL SULFATE HFA 108 (90 BASE) MCG/ACT IN AERS
2.0000 | INHALATION_SPRAY | Freq: Four times a day (QID) | RESPIRATORY_TRACT | Status: DC
  Administered 2019-09-05 – 2019-09-07 (×8): 2 via RESPIRATORY_TRACT

## 2019-09-04 MED ORDER — ENOXAPARIN SODIUM 40 MG/0.4ML ~~LOC~~ SOLN
40.0000 mg | Freq: Two times a day (BID) | SUBCUTANEOUS | Status: DC
  Administered 2019-09-04 – 2019-09-07 (×7): 40 mg via SUBCUTANEOUS
  Filled 2019-09-04 (×7): qty 0.4

## 2019-09-04 MED ORDER — ENOXAPARIN SODIUM 60 MG/0.6ML ~~LOC~~ SOLN: 60.0000 mg | Freq: Two times a day (BID) | SUBCUTANEOUS | Status: DC

## 2019-09-04 MED ORDER — ALBUTEROL SULFATE HFA 108 (90 BASE) MCG/ACT IN AERS: 4.0000 | INHALATION_SPRAY | RESPIRATORY_TRACT | Status: DC | PRN

## 2019-09-04 MED ORDER — CALCIUM CARBONATE ANTACID 500 MG PO CHEW
400.0000 mg | CHEWABLE_TABLET | Freq: Three times a day (TID) | ORAL | Status: AC
  Administered 2019-09-04 – 2019-09-06 (×5): 400 mg via ORAL
  Filled 2019-09-04 (×5): qty 2

## 2019-09-04 MED ORDER — SALINE SPRAY 0.65 % NA SOLN
1.0000 | NASAL | Status: DC | PRN
  Administered 2019-09-04: 1 via NASAL
  Filled 2019-09-04: qty 44

## 2019-09-04 NOTE — Progress Notes (Signed)
Patient Demographics:    Erroll LunaReginald Flatt, is a 34 y.o. male, DOB - 03-Aug-1985, ZOX:[id number]  Admit date - 09/02/2019   Admitting Physician Frankey Shownladapo Adefeso, DO  Outpatient Primary MD for the patient is [name], Christy A, FNP  LOS - 1   Chief Complaint  Patient presents with  . Shortness of Breath    COVID +        Subjective:    Erroll LunaReginald Conrey today has fevers/chills/malaise,   No chest pain,   -Patient has nausea, fatigue, dyspnea on exertion, shortness of breath,  --Hypoxia persist, requiring up to 6 L of oxygen at rest (however his oxygen called is now extended) -Cough persist -Updated patient's wife by phone-  Had 4 Loose stools today, had nausea, no frank emesis -Complains of heartburn  Assessment  & Plan :    Principal Problem:   COVID-19 virus infection Active Problems:   Obesity (BMI 30.0-34.9)   Hyponatremia   Hypokalemia   Hypocalcemia   Transaminitis   Pneumonia due to COVID-19 virus  Brief Summary:- 34 y.o. male reformed smoker with past medical history relevant for GERD, obesity, and who is unvaccinated against COVID-19 infection presents with worsening shortness of breath and found to be hypoxic in the setting of positive COVID-19 test inpatient and other family members as well as CTA chest consistent with bilateral Covid pneumonia (tested positive on 08/30/2019) -Pt received casirivimab\imdevimab infusion as outpatient on 09/02/2019 prior to admission  A/p 1)Acute hypoxic respiratory failure secondary to COVID-19 infection/pneumonia---  - the treatment plan and use of medications  for treatment of COVID-19 infection and possible side effects were discussed with patient ----Patient verbalizes understanding and agrees to treatment protocols   --Patient is positive for COVID-19 infection, CTA chest with findings of infiltrates/opacities,  patient is hypoxic and requiring  continuous supplemental oxygen---patient meets criteria for initiation of Remdesivir AND Decadron/Steroid therapy per protocol  PCT-- 2.0 >>1.27 Ferritin- 170>>185>>261 D-Dimer 0.66>>0.64>>0.62 CRP  6.5 >> 11.7>>11.3 -- trend fibrinogen, CRP, pro calcitonin, CBC, BMP, d-dimer, LDH, ferritin and LFTs --Supplemental oxygen to keep O2 sats above 93%---currently requiring up to 6 L of oxygen through very distended oxygen tube -Follow serial chest x-rays and ABGs as indicated --- Encourage prone positioning for More than 16 hours/day in increments of 2 to 3 hours at a time if able to tolerate --Attempt to maintain euvolemic state --Zinc and vitamin C as ordered -Albuterol inhaler as needed -Received Decadron in the ED switched to IV Solu-Medrol 0.5 mg/kg every 12 - 2)Obesity-obesity confers higher risk from a COVID-19 mobility mortality standpoint -Low calorie diet, portion control and increase physical activity discussed with patient--this can be addressed after resolution of his acute infection symptoms -Body mass index is 36.29 kg/m.   3) hyperglycemia--- anticipate worsening glycemic control with steroids Use Novolog/Humalog Sliding scale insulin with Accu-Cheks/Fingersticks as ordered  4)HTN--elevated BP noted, anticipate worsening BP control with steroids ---c/n amlodipine    may use IV labetalol when necessary  Every 4 hours for systolic blood pressure over 160 mmhg   Disposition/Need for in-Hospital Stay- patient unable to be discharged at this time due to --- hypoxic respiratory failure in the setting of bilateral Covid pneumonia requiring oxygen supplementation, IV steroids and IV remdesivir  Status is:  Inpatient  Remains inpatient appropriate because:hypoxic respiratory failure in the setting of bilateral Covid pneumonia requiring oxygen supplementation, IV steroids and IV remdesivir   Disposition: The patient is from: Home              Anticipated d/c is to: Home               Anticipated d/c date is: 2 days              Patient currently is not medically stable to d/c. Barriers: Not Clinically Stable- -hypoxic respiratory failure in the setting of bilateral Covid pneumonia requiring oxygen supplementation, IV steroids and IV remdesivir  Code Status : Full code  Family Communication:    patient is alert, awake and coherent Discussed with wife   Consults  :  na  DVT Prophylaxis  :  Lovenox   - SCDs   Lab Results  Component Value Date   PLT 208 09/04/2019    Inpatient Medications  Scheduled Meds: . albuterol  2 puff Inhalation Q4H  . amLODipine  10 mg Oral Daily  . vitamin C  1,000 mg Oral BID  . calcium carbonate  400 mg of elemental calcium Oral TID WC  . calcium-vitamin D  1 tablet Oral Q breakfast  . Chlorhexidine Gluconate Cloth  6 each Topical Daily  . dextromethorphan-guaiFENesin  1 tablet Oral BID  . enoxaparin (LOVENOX) injection  40 mg Subcutaneous Q12H  . insulin aspart  0-5 Units Subcutaneous QHS  . insulin aspart  0-6 Units Subcutaneous TID WC  . mouth rinse  15 mL Mouth Rinse BID  . methylPREDNISolone (SOLU-MEDROL) injection  60 mg Intravenous Q12H  . zinc sulfate  220 mg Oral Daily   Continuous Infusions: . remdesivir 100 mg in NS 100 mL 100 mg (09/04/19 0941)   PRN Meds:.acetaminophen, chlorpheniramine-HYDROcodone, guaiFENesin-dextromethorphan, labetalol, ondansetron (ZOFRAN) IV, sodium chloride    Anti-infectives (From admission, onward)   Start     Dose/Rate Route Frequency Ordered Stop   09/04/19 1000  remdesivir 100 mg in sodium chloride 0.9 % 100 mL IVPB  Status:  Discontinued       "Followed by" Linked Group Details   100 mg 200 mL/hr over 30 Minutes Intravenous Daily 09/03/19 0536 09/03/19 0537   09/04/19 1000  remdesivir 100 mg in sodium chloride 0.9 % 100 mL IVPB     Discontinue    "Followed by" Linked Group Details   100 mg 200 mL/hr over 30 Minutes Intravenous Daily 09/03/19 0539 09/08/19 0959   09/03/19 0545   remdesivir 100 mg in sodium chloride 0.9 % 100 mL IVPB  Status:  Discontinued        100 mg 200 mL/hr over 30 Minutes Intravenous Every 30 min 09/03/19 0539 09/03/19 0540   09/03/19 0545  remdesivir 100 mg in sodium chloride 0.9 % 100 mL IVPB       "Followed by" Linked Group Details   100 mg 200 mL/hr over 30 Minutes Intravenous Every 30 min 09/03/19 0539 09/03/19 0729   09/03/19 0530  remdesivir 200 mg in sodium chloride 0.9% 250 mL IVPB  Status:  Discontinued       "Followed by" Linked Group Details   200 mg 580 mL/hr over 30 Minutes Intravenous Once 09/03/19 0536 09/03/19 0537        Objective:   Vitals:   09/04/19 1400 09/04/19 1500 09/04/19 1612 09/04/19 1618  BP: 128/77 140/85  (!) 145/82  Pulse: (!) 102 92  (!) 116  Resp: (!) 28 20  18   Temp:    98 F (36.7 C)  TempSrc:    Oral  SpO2: 95% 92% 91% 92%  Weight:      Height:        Wt Readings from Last 3 Encounters:  09/03/19 105.1 kg  08/30/19 106.6 kg  06/19/19 106.6 kg     Intake/Output Summary (Last 24 hours) at 09/04/2019 1709 Last data filed at 09/04/2019 1600 Gross per 24 hour  Intake 1100 ml  Output --  Net 1100 ml    Physical Exam  Gen:- Awake Alert, respiratory distress, only able to speak in short sentences  HEENT:- [place].AT, No sclera icterus Nose- [place] 6L/min Neck-Supple Neck,No JVD,.  Lungs-diminished breath sounds, few scattered wheezes  CV- S1, S2 normal, regular  Abd-  +ve B.Sounds, Abd Soft, No tenderness, increased truncal adiposity Extremity/Skin:- No  edema, pedal pulses present  Psych-affect is appropriate, oriented x3 Neuro-generalized weakness, no new focal deficits, no tremors   Data Review:   Micro Results Recent Results (from the past 240 hour(s))  Novel Coronavirus, NAA ([facility])     Status: Abnormal   Collection Time: 08/30/19  7:23 PM   Specimen: Nasal Swab; Nasopharyngeal(NP) swabs in vial transport medium   Nasopharynge  Patient  Result Value Ref Range Status    SARS-CoV-2, NAA Detected (A) Not Detected Final    Comment: Patients who have a positive COVID-19 test result may now have treatment options. Treatment options are available for patients with mild to moderate symptoms and for hospitalized patients. Visit our website at 10/31/19 for resources and information. This nucleic acid amplification test was developed and its performance characteristics determined by [employer]. Nucleic acid amplification tests include RT-PCR and TMA. This test has not been FDA cleared or approved. This test has been authorized by FDA under an Emergency Use Authorization (EUA). This test is only authorized for the duration of time the declaration that circumstances exist justifying the authorization of the emergency use of in vitro diagnostic tests for detection of SARS-CoV-2 virus and/or diagnosis of COVID-19 infection under section 564(b)(1) of the Act, 21 U.S.C. [employer]) (1), unless the authorization is terminated or revoked sooner. When diagnostic testing is negativ e, the possibility of a false negative result should be considered in the context of a patient's recent exposures and the presence of clinical signs and symptoms consistent with COVID-19. An individual without symptoms of COVID-19 and who is not shedding SARS-CoV-2 virus would expect to have a negative (not detected) result in this assay.   SARS-COV-2, NAA 2 DAY TAT     Status: None   Collection Time: 08/30/19  7:23 PM   Nasopharynge  Patient  Result Value Ref Range Status   SARS-CoV-2, NAA 2 DAY TAT Performed  Final  Culture, group A strep     Status: None   Collection Time: 08/30/19  7:32 PM   Specimen: Throat  Result Value Ref Range Status   Specimen Description   Final    THROAT Performed at [facility], [address] [place], [place]    Special Requests   Final    NONE Performed at [facility], [address], [place], [place]  [place]    Culture   Final    NO GROUP A STREP (S.PYOGENES) ISOLATED Performed at [facility], [address] [place], [place]    Report Status 09/03/2019 FINAL  Final  Blood Culture (routine x 2)  Status: None (Preliminary result)   Collection Time: 09/02/19  9:51 PM   Specimen: BLOOD  Result Value Ref Range Status   Specimen Description BLOOD RIGHT ANTECUBITAL  Final   Special Requests   Final    BOTTLES DRAWN AEROBIC AND ANAEROBIC Blood Culture adequate volume   Culture   Final    NO GROWTH 2 DAYS Performed at [facility], [address], [place], [place] [address]    Report Status PENDING  Incomplete  Blood Culture (routine x 2)     Status: None (Preliminary result)   Collection Time: 09/02/19  9:53 PM   Specimen: BLOOD RIGHT HAND  Result Value Ref Range Status   Specimen Description BLOOD RIGHT HAND  Final   Special Requests AEROBIC BOTTLE ONLY Blood Culture adequate volume  Final   Culture   Final    NO GROWTH 2 DAYS Performed at [facility], [address], [place], [place] [address]    Report Status PENDING  Incomplete  MRSA PCR Screening     Status: None   Collection Time: 09/03/19  8:32 AM   Specimen: Nasal Mucosa; Nasopharyngeal  Result Value Ref Range Status   MRSA by PCR NEGATIVE NEGATIVE Final    Comment:        The GeneXpert MRSA Assay (FDA approved for NASAL specimens only), is one component of a comprehensive MRSA colonization surveillance program. It is not intended to diagnose MRSA infection nor to guide or monitor treatment for MRSA infections. Performed at [facility], [address] [place], [place] [address]    Radiology Reports CT ANGIO CHEST PE W OR WO CONTRAST  Result Date: 09/03/2019 CLINICAL DATA:  34 year old male recently positive for COVID-19. Progressive shortness of breath today. EXAM: CT ANGIOGRAPHY CHEST WITH CONTRAST TECHNIQUE: Multidetector CT imaging of the chest was performed using the standard protocol  during bolus administration of intravenous contrast. Multiplanar CT image reconstructions and MIPs were obtained to evaluate the vascular anatomy. CONTRAST:  OMNIPAQUE IOHEXOL 350 MG/ML SOLN COMPARISON:  Portable chest 09/02/2019 FINDINGS: Cardiovascular: Adequate contrast bolus timing in the pulmonary arterial tree. Respiratory motion. No central or hilar pulmonary embolus. The upper and middle lobe pulmonary artery branches are fairly well delineated and appear normal. Lower lobe branch detail is more degraded by motion. Cardiac size at the upper limits of normal. No pericardial effusion. Negative visible aorta. Mediastinum/Nodes: Negative.  No lymphadenopathy. Lungs/Pleura: Major airways are patent. Widespread bilateral peribronchial and peripheral confluent pulmonary ground-glass opacity. The left upper lobe is least affected. No pleural effusion. No areas of solid lung consolidation at this time. Upper Abdomen: Possible hepatic steatosis. Otherwise negative visible liver, spleen, pancreas, left adrenal gland, bowel. Musculoskeletal: Negative. Review of the MIP images confirms the above findings. IMPRESSION: 1. No acute pulmonary embolus identified, some lower lobe branches are degraded by motion. 2. Widespread bilateral COVID-19 pneumonia. No pleural effusion. 3. Possible hepatic steatosis. Electronically Signed   By: Odessa Fleming M.D.   On: 09/03/2019 01:41   DG Chest Port 1 View  Result Date: 09/02/2019 CLINICAL DATA:  Dyspnea, COVID positive EXAM: PORTABLE CHEST 1 VIEW COMPARISON:  None. FINDINGS: Lung volumes are small. Mild left basilar atelectasis or infiltrate. No pneumothorax or pleural effusion. Cardiac size within normal limits. No acute bone abnormality. IMPRESSION: Pulmonary hypoinflation. Superimposed left basilar atelectasis or infiltrate. Electronically Signed   By: Helyn Numbers MD   On: 09/02/2019 19:01     CBC Recent Labs  Lab 09/02/19 2153 09/03/19 3893 09/04/19 7342  WBC 6.5  6.4 3.9*  HGB 13.2 13.4 14.5  HCT 40.3 40.6 44.5  PLT 166 168 208  MCV 83.4 83.4 82.6  MCH 27.3 27.5 26.9  MCHC 32.8 33.0 32.6  RDW 12.9 13.1 12.9  LYMPHSABS 0.9 0.9 0.6*  MONOABS 0.2 0.2 0.2  EOSABS 0.0 0.0 0.0  BASOSABS 0.0 0.0 0.0    Chemistries  Recent Labs  Lab 09/02/19 2153 09/03/19 0543 09/04/19 0513  NA 134* 134* 137  K 3.1* 3.6 3.7  CL 105 103 102  CO2 19* 22 23  GLUCOSE 109* 126* 161*  BUN CREATININE 0.87 0.84 0.78  CALCIUM 7.8* 8.4* 8.9  MG  --  1.9 2.3  AST 46* 47* 42*  ALT 54* 54* 55*  ALKPHOS 55 57 54  BILITOT 0.4 0.3 0.5   ------------------------------------------------------------------------------------------------------------------ Recent Labs    09/02/19 2153  TRIG 91    No results found for: HGBA1C ------------------------------------------------------------------------------------------------------------------ No results for input(s): TSH, T4TOTAL, T3FREE, THYROIDAB in the last 72 hours.  Invalid input(s): FREET3 ------------------------------------------------------------------------------------------------------------------ Recent Labs    09/03/19 0543 09/04/19 0513  FERRITIN 185 261    Coagulation profile No results for input(s): INR, PROTIME in the last 168 hours.  Recent Labs    09/03/19 0543 09/04/19 0513  DDIMER 0.64* 0.62*    Cardiac Enzymes No results for input(s): CKMB, TROPONINI, MYOGLOBIN in the last 168 hours.  Invalid input(s): CK ------------------------------------------------------------------------------------------------------------------ No results found for: BNP   Shon Hale M.D on 09/04/2019 at 5:09 PM  Go to [email or web address] - for contact info  [facility] - Office  [phone]

## 2019-09-04 NOTE — Progress Notes (Signed)
Patient sitting on bedside saturation bouncing from 94 to 89 on 6 liters, No wheezes noted , able to do 1250 on incentive. Patient states still not able to take full deep breaths. Albuterol inhaler decreased to Q6

## 2019-09-04 NOTE — Progress Notes (Signed)
Patient asleep no mdi given for 12 midnight. Patient on rt side facing window ,snoring , saturation 93 on 6 lites oxygen

## 2019-09-05 LAB — PHOSPHORUS: Phosphorus: 3.9 mg/dL (ref 2.5–4.6)

## 2019-09-05 LAB — CBC WITH DIFFERENTIAL/PLATELET
Abs Immature Granulocytes: 0.06 10*3/uL (ref 0.00–0.07)
Basophils Absolute: 0 10*3/uL (ref 0.0–0.1)
Basophils Relative: 0 %
Eosinophils Absolute: 0 10*3/uL (ref 0.0–0.5)
Eosinophils Relative: 0 %
HCT: 44.7 % (ref 39.0–52.0)
Hemoglobin: 14.8 g/dL (ref 13.0–17.0)
Immature Granulocytes: 1 %
Lymphocytes Relative: 11 %
Lymphs Abs: 0.8 10*3/uL (ref 0.7–4.0)
MCH: 27.3 pg (ref 26.0–34.0)
MCHC: 33.1 g/dL (ref 30.0–36.0)
MCV: 82.3 fL (ref 80.0–100.0)
Monocytes Absolute: 0.3 10*3/uL (ref 0.1–1.0)
Monocytes Relative: 4 %
Neutro Abs: 6.6 10*3/uL (ref 1.7–7.7)
Neutrophils Relative %: 84 %
Platelets: 254 10*3/uL (ref 150–400)
RBC: 5.43 MIL/uL (ref 4.22–5.81)
RDW: 12.5 % (ref 11.5–15.5)
WBC: 7.8 10*3/uL (ref 4.0–10.5)
nRBC: 0 % (ref 0.0–0.2)

## 2019-09-05 LAB — COMPREHENSIVE METABOLIC PANEL
ALT: 55 U/L — ABNORMAL HIGH (ref 0–44)
AST: 36 U/L (ref 15–41)
Albumin: 3.5 g/dL (ref 3.5–5.0)
Alkaline Phosphatase: 52 U/L (ref 38–126)
Anion gap: 10 (ref 5–15)
BUN: 17 mg/dL (ref 6–20)
CO2: 25 mmol/L (ref 22–32)
Calcium: 8.7 mg/dL — ABNORMAL LOW (ref 8.9–10.3)
Chloride: 102 mmol/L (ref 98–111)
Creatinine, Ser: 0.74 mg/dL (ref 0.61–1.24)
GFR calc Af Amer: >60 mL/min (ref >60)
GFR calc non Af Amer: >60 mL/min (ref >60)
Glucose, Bld: 139 mg/dL — ABNORMAL HIGH (ref 70–99)
Potassium: 4.1 mmol/L (ref 3.5–5.1)
Sodium: 137 mmol/L (ref 135–145)
Total Bilirubin: 0.5 mg/dL (ref 0.3–1.2)
Total Protein: 7.3 g/dL (ref 6.5–8.1)

## 2019-09-05 LAB — FERRITIN: Ferritin: 227 ng/mL (ref 24–336)

## 2019-09-05 LAB — C-REACTIVE PROTEIN: CRP: 2.8 mg/dL — ABNORMAL HIGH (ref <1.0)

## 2019-09-05 LAB — HEMOGLOBIN A1C
Hgb A1c MFr Bld: 5.9 % — ABNORMAL HIGH (ref 4.8–5.6)
Mean Plasma Glucose: 122.63 mg/dL

## 2019-09-05 LAB — D-DIMER, QUANTITATIVE: D-Dimer, Quant: 0.3 ug/mL-FEU (ref 0.00–0.50)

## 2019-09-05 LAB — MAGNESIUM: Magnesium: 2.5 mg/dL — ABNORMAL HIGH (ref 1.7–2.4)

## 2019-09-05 NOTE — Progress Notes (Addendum)
Patient Demographics:    Luis Simpson, is a 34 y.o. male, DOB - 14-Apr-1985, EHO:[id number]  Admit date - 09/02/2019   Admitting Physician Frankey Shown, DO  Outpatient Primary MD for the patient is Junie Spencer, FNP  LOS - 2   Chief Complaint  Patient presents with  . Shortness of Breath    COVID +        Subjective:    Luis Simpson today has No chest pain,    =-Frequency and consistency of stools have improved -Cough, shortness of breath persist -Oxygen requirement improving down to 4 to 5 L versus 6 L overnight  Assessment  & Plan :    Principal Problem:   COVID-19 virus infection Active Problems:   Obesity (BMI 30.0-34.9)   Hyponatremia   Hypokalemia   Hypocalcemia   Transaminitis   Pneumonia due to COVID-19 virus  Brief Summary:- 34 y.o. male reformed smoker with past medical history relevant for GERD, obesity, and who is unvaccinated against COVID-19 infection presents with worsening shortness of breath and found to be hypoxic in the setting of positive COVID-19 test inpatient and other family members as well as CTA chest consistent with bilateral Covid pneumonia (tested positive on 08/30/2019) -Pt received casirivimab\imdevimab infusion as outpatient on 09/02/2019 prior to admission  A/p 1)Acute hypoxic respiratory failure secondary to COVID-19 infection/pneumonia---  - the treatment plan and use of medications  for treatment of COVID-19 infection and possible side effects were discussed with patient ----Patient verbalizes understanding and agrees to treatment protocols   --Patient is positive for COVID-19 infection, CTA chest with findings of infiltrates/opacities,  patient is hypoxic and requiring continuous supplemental oxygen---patient meets criteria for initiation of Remdesivir AND Decadron/Steroid therapy per protocol  -Remdesivir day # 3 PCT-- 2.0 >>1.27 Ferritin-  170>>185>>261>>227 D-Dimer 0.66>>0.64>>0.62>>0.30 CRP  6.5 >> 11.7>>11.3>>2.8 -- trend   CRP,   CBC, BMP, d-dimer, LDH, ferritin and LFTs --Supplemental oxygen to keep O2 sats above 93%---  Oxygen requirement improving down to 4 to 5 L versus 6 L overnight ---Follow serial chest x-rays and ABGs as indicated --- Encourage prone positioning for More than 16 hours/day in increments of 2 to 3 hours at a time if able to tolerate --Attempt to maintain euvolemic state --Zinc and vitamin C as ordered -Albuterol inhaler as needed -Received Decadron in the ED switched to IV Solu-Medrol 0.5 mg/kg every 12 -  2)Obesity-obesity confers higher risk from a COVID-19 mobility mortality standpoint -Low calorie diet, portion control and increase physical activity discussed with patient--this can be addressed after resolution of his acute infection symptoms -Body mass index is 36.29 kg/m.   3)Hyperglycemia--- anticipate worsening glycemic control with steroids Use Novolog/Humalog Sliding scale insulin with Accu-Cheks/Fingersticks as ordered  4)HTN--elevated BP noted, anticipate worsening BP control with steroids ---c/n amlodipine    may use IV labetalol when necessary  Every 4 hours for systolic blood pressure over 160 mmhg   Disposition/Need for in-Hospital Stay- patient unable to be discharged at this time due to --- hypoxic respiratory failure in the setting of bilateral Covid pneumonia requiring oxygen supplementation, IV steroids and IV remdesivir  Status is: Inpatient  Remains inpatient appropriate because:hypoxic respiratory failure in the setting of bilateral Covid pneumonia requiring oxygen supplementation, IV steroids  and IV remdesivir   Disposition: The patient is from: Home              Anticipated d/c is to: Home              Anticipated d/c date is: 2 days              Patient currently is not medically stable to d/c. Barriers: Not Clinically Stable- -hypoxic respiratory failure in the  setting of bilateral Covid pneumonia requiring oxygen supplementation, IV steroids and IV remdesivir  Code Status : Full code  Family Communication:    patient is alert, awake and coherent Discussed with wife   Consults  :  na  DVT Prophylaxis  :  Lovenox   - SCDs   Lab Results  Component Value Date   PLT 254 09/05/2019    Inpatient Medications  Scheduled Meds: . albuterol  2 puff Inhalation Q6H  . amLODipine  10 mg Oral Daily  . vitamin C  1,000 mg Oral BID  . calcium carbonate  400 mg of elemental calcium Oral TID WC  . calcium-vitamin D  1 tablet Oral Q breakfast  . Chlorhexidine Gluconate Cloth  6 each Topical Daily  . dextromethorphan-guaiFENesin  1 tablet Oral BID  . enoxaparin (LOVENOX) injection  40 mg Subcutaneous Q12H  . insulin aspart  0-5 Units Subcutaneous QHS  . insulin aspart  0-6 Units Subcutaneous TID WC  . mouth rinse  15 mL Mouth Rinse BID  . methylPREDNISolone (SOLU-MEDROL) injection  60 mg Intravenous Q12H  . zinc sulfate  220 mg Oral Daily   Continuous Infusions: . remdesivir 100 mg in NS 100 mL 100 mg (09/05/19 0832)   PRN Meds:.acetaminophen, albuterol, chlorpheniramine-HYDROcodone, guaiFENesin-dextromethorphan, labetalol, ondansetron (ZOFRAN) IV, sodium chloride    Anti-infectives (From admission, onward)   Start     Dose/Rate Route Frequency Ordered Stop   09/04/19 1000  remdesivir 100 mg in sodium chloride 0.9 % 100 mL IVPB  Status:  Discontinued       "Followed by" Linked Group Details   100 mg 200 mL/hr over 30 Minutes Intravenous Daily 09/03/19 0536 09/03/19 0537   09/04/19 1000  remdesivir 100 mg in sodium chloride 0.9 % 100 mL IVPB     Discontinue    "Followed by" Linked Group Details   100 mg 200 mL/hr over 30 Minutes Intravenous Daily 09/03/19 0539 09/08/19 0959   09/03/19 0545  remdesivir 100 mg in sodium chloride 0.9 % 100 mL IVPB  Status:  Discontinued        100 mg 200 mL/hr over 30 Minutes Intravenous Every 30 min 09/03/19  0539 09/03/19 0540   09/03/19 0545  remdesivir 100 mg in sodium chloride 0.9 % 100 mL IVPB       "Followed by" Linked Group Details   100 mg 200 mL/hr over 30 Minutes Intravenous Every 30 min 09/03/19 0539 09/03/19 0729   09/03/19 0530  remdesivir 200 mg in sodium chloride 0.9% 250 mL IVPB  Status:  Discontinued       "Followed by" Linked Group Details   200 mg 580 mL/hr over 30 Minutes Intravenous Once 09/03/19 0536 09/03/19 0537        Objective:   Vitals:   09/05/19 0600 09/05/19 0700 09/05/19 0721 09/05/19 0800  BP: 114/64 128/80  121/78  Pulse: 76 76 72 81  Resp: 20  19 (!) 25  Temp:    97.7 F (36.5 C)  TempSrc:    Oral  SpO2: 95% 94% 94% 93%  Weight:      Height:        Wt Readings from Last 3 Encounters:  09/03/19 105.1 kg  08/30/19 106.6 kg  06/19/19 106.6 kg     Intake/Output Summary (Last 24 hours) at 09/05/2019 1020 Last data filed at 09/05/2019 0200 Gross per 24 hour  Intake 600 ml  Output --  Net 600 ml    Physical Exam  Gen:- Awake Alert, respiratory distress, only able to speak in short sentences  HEENT:- [place].AT, No sclera icterus Nose- [employer] [place] 4L/min Neck-Supple Neck,No JVD,.  Lungs-diminished breath sounds, few scattered wheezes  CV- S1, S2 normal, regular  Abd-  +ve B.Sounds, Abd Soft, No tenderness, increased truncal adiposity Extremity/Skin:- No  edema, pedal pulses present  Psych-affect is appropriate, oriented x3 Neuro-generalized weakness, no new focal deficits, no tremors   Data Review:   Micro Results Recent Results (from the past 240 hour(s))  Novel Coronavirus, NAA ([facility])     Status: Abnormal   Collection Time: 08/30/19  7:23 PM   Specimen: Nasal Swab; Nasopharyngeal(NP) swabs in vial transport medium   Nasopharynge  Patient  Result Value Ref Range Status   SARS-CoV-2, NAA Detected (A) Not Detected Final    Comment: Patients who have a positive COVID-19 test result may now have treatment options. Treatment options are available  for patients with mild to moderate symptoms and for hospitalized patients. Visit our website at [email or web address] for resources and information. This nucleic acid amplification test was developed and its performance characteristics determined by [employer]. Nucleic acid amplification tests include RT-PCR and TMA. This test has not been FDA cleared or approved. This test has been authorized by FDA under an Emergency Use Authorization (EUA). This test is only authorized for the duration of time the declaration that circumstances exist justifying the authorization of the emergency use of in vitro diagnostic tests for detection of SARS-CoV-2 virus and/or diagnosis of COVID-19 infection under section 564(b)(1) of the Act, 21 U.S.C. 916BWG-6(K) (1), unless the authorization is terminated or revoked sooner. When diagnostic testing is negativ e, the possibility of a false negative result should be considered in the context of a patient's recent exposures and the presence of clinical signs and symptoms consistent with COVID-19. An individual without symptoms of COVID-19 and who is not shedding SARS-CoV-2 virus would expect to have a negative (not detected) result in this assay.   SARS-COV-2, NAA 2 DAY TAT     Status: None   Collection Time: 08/30/19  7:23 PM   Nasopharynge  Patient  Result Value Ref Range Status   SARS-CoV-2, NAA 2 DAY TAT Performed  Final  Culture, group A strep     Status: None   Collection Time: 08/30/19  7:32 PM   Specimen: Throat  Result Value Ref Range Status   Specimen Description   Final    THROAT Performed at [facility], [address] [place], [place] [address]    Special Requests   Final    NONE Performed at [facility], [address] [place], [place] [address]    Culture   Final    NO GROUP A STREP (S.PYOGENES) ISOLATED Performed at [facility], [address] [place], [place] [address]    Report Status 09/03/2019  FINAL  Final  Blood Culture (routine x 2)     Status: None (Preliminary result)   Collection Time: 09/02/19  9:51 PM   Specimen: BLOOD  Result Value Ref Range  Status   Specimen Description BLOOD RIGHT ANTECUBITAL  Final   Special Requests   Final    BOTTLES DRAWN AEROBIC AND ANAEROBIC Blood Culture adequate volume   Culture   Final    NO GROWTH 3 DAYS Performed at [facility], [address] [place], [place] [address]    Report Status PENDING  Incomplete  Blood Culture (routine x 2)     Status: None (Preliminary result)   Collection Time: 09/02/19  9:53 PM   Specimen: BLOOD RIGHT HAND  Result Value Ref Range Status   Specimen Description BLOOD RIGHT HAND  Final   Special Requests AEROBIC BOTTLE ONLY Blood Culture adequate volume  Final   Culture   Final    NO GROWTH 3 DAYS Performed at [facility], [address] [place], [place] [address]    Report Status PENDING  Incomplete  MRSA PCR Screening     Status: None   Collection Time: 09/03/19  8:32 AM   Specimen: Nasal Mucosa; Nasopharyngeal  Result Value Ref Range Status   MRSA by PCR NEGATIVE NEGATIVE Final    Comment:        The GeneXpert MRSA Assay (FDA approved for NASAL specimens only), is one component of a comprehensive MRSA colonization surveillance program. It is not intended to diagnose MRSA infection nor to guide or monitor treatment for MRSA infections. Performed at [facility], [address] [place], [place] [address]    Radiology Reports CT ANGIO CHEST PE W OR WO CONTRAST  Result Date: 09/03/2019 CLINICAL DATA:  34 year old male recently positive for COVID-19. Progressive shortness of breath today. EXAM: CT ANGIOGRAPHY CHEST WITH CONTRAST TECHNIQUE: Multidetector CT imaging of the chest was performed using the standard protocol during bolus administration of intravenous contrast. Multiplanar CT image reconstructions and MIPs were obtained to evaluate the vascular anatomy. CONTRAST:  100mL OMNIPAQUE IOHEXOL  350 MG/ML SOLN COMPARISON:  Portable chest 09/02/2019 FINDINGS: Cardiovascular: Adequate contrast bolus timing in the pulmonary arterial tree. Respiratory motion. No central or hilar pulmonary embolus. The upper and middle lobe pulmonary artery branches are fairly well delineated and appear normal. Lower lobe branch detail is more degraded by motion. Cardiac size at the upper limits of normal. No pericardial effusion. Negative visible aorta. Mediastinum/Nodes: Negative.  No lymphadenopathy. Lungs/Pleura: Major airways are patent. Widespread bilateral peribronchial and peripheral confluent pulmonary ground-glass opacity. The left upper lobe is least affected. No pleural effusion. No areas of solid lung consolidation at this time. Upper Abdomen: Possible hepatic steatosis. Otherwise negative visible liver, spleen, pancreas, left adrenal gland, bowel. Musculoskeletal: Negative. Review of the MIP images confirms the above findings. IMPRESSION: 1. No acute pulmonary embolus identified, some lower lobe branches are degraded by motion. 2. Widespread bilateral COVID-19 pneumonia. No pleural effusion. 3. Possible hepatic steatosis. Electronically Signed   By: Odessa FlemingH  Hall M.D.   On: 09/03/2019 01:41   DG Chest Port 1 View  Result Date: 09/02/2019 CLINICAL DATA:  Dyspnea, COVID positive EXAM: PORTABLE CHEST 1 VIEW COMPARISON:  None. FINDINGS: Lung volumes are small. Mild left basilar atelectasis or infiltrate. No pneumothorax or pleural effusion. Cardiac size within normal limits. No acute bone abnormality. IMPRESSION: Pulmonary hypoinflation. Superimposed left basilar atelectasis or infiltrate. Electronically Signed   By: Helyn NumbersAshesh  Parikh MD   On: 09/02/2019 19:01     CBC Recent Labs  Lab 09/02/19 2153 09/03/19 0543 09/04/19 0513 09/05/19 0514  WBC 6.5 6.4 3.9* 7.8  HGB 13.2 13.4 14.5 14.8  HCT 40.3 40.6 44.5 44.7  PLT 166 168 208 254  MCV 83.4 83.4 82.6 82.3  MCH 27.3 27.5 26.9 27.3  MCHC 32.8 33.0 32.6 33.1    RDW 12.9 13.1 12.9 12.5  LYMPHSABS 0.9 0.9 0.6* 0.8  MONOABS 0.2 0.2 0.2 0.3  EOSABS 0.0 0.0 0.0 0.0  BASOSABS 0.0 0.0 0.0 0.0    Chemistries  Recent Labs  Lab 09/02/19 2153 09/03/19 0543 09/04/19 0513 09/05/19 0514  NA 134* 134* 137 137  K 3.1* 3.6 3.7 4.1  CL 105 103 102 102  CO2 19* GLUCOSE 109* 126* 161* 139*  BUN CREATININE 0.87 0.84 0.78 0.74  CALCIUM 7.8* 8.4* 8.9 8.7*  MG  --  1.9 2.3 2.5*  AST 46* 47* 42* 36  ALT 54* 54* 55* 55*  ALKPHOS 55 57 54 52  BILITOT 0.4 0.3 0.5 0.5   ------------------------------------------------------------------------------------------------------------------ Recent Labs    09/02/19 2153  TRIG 91    No results found for: HGBA1C ------------------------------------------------------------------------------------------------------------------ No results for input(s): TSH, T4TOTAL, T3FREE, THYROIDAB in the last 72 hours.  Invalid input(s): FREET3 ------------------------------------------------------------------------------------------------------------------ Recent Labs    09/04/19 0513 09/05/19 0514  FERRITIN 261 227    Coagulation profile No results for input(s): INR, PROTIME in the last 168 hours.  Recent Labs    09/04/19 0513 09/05/19 0514  DDIMER 0.62* 0.30    Cardiac Enzymes No results for input(s): CKMB, TROPONINI, MYOGLOBIN in the last 168 hours.  Invalid input(s): CK ------------------------------------------------------------------------------------------------------------------ No results found for: BNP   Shon Hale M.D on 09/05/2019 at 10:20 AM  Go to [email or web address] - for contact info  [facility] - Office  [phone]

## 2019-09-06 ENCOUNTER — Telehealth: Payer: Self-pay | Admitting: Family

## 2019-09-06 LAB — COMPREHENSIVE METABOLIC PANEL
ALT: 90 U/L — ABNORMAL HIGH (ref 0–44)
AST: 59 U/L — ABNORMAL HIGH (ref 15–41)
Albumin: 3.5 g/dL (ref 3.5–5.0)
Alkaline Phosphatase: 51 U/L (ref 38–126)
Anion gap: 11 (ref 5–15)
BUN: 20 mg/dL (ref 6–20)
CO2: 25 mmol/L (ref 22–32)
Calcium: 8.5 mg/dL — ABNORMAL LOW (ref 8.9–10.3)
Chloride: 100 mmol/L (ref 98–111)
Creatinine, Ser: 0.68 mg/dL (ref 0.61–1.24)
GFR calc Af Amer: >60 mL/min (ref >60)
GFR calc non Af Amer: >60 mL/min (ref >60)
Glucose, Bld: 131 mg/dL — ABNORMAL HIGH (ref 70–99)
Potassium: 4 mmol/L (ref 3.5–5.1)
Sodium: 136 mmol/L (ref 135–145)
Total Bilirubin: 0.7 mg/dL (ref 0.3–1.2)
Total Protein: 7.1 g/dL (ref 6.5–8.1)

## 2019-09-06 LAB — CBC WITH DIFFERENTIAL/PLATELET
Abs Immature Granulocytes: 0.13 10*3/uL — ABNORMAL HIGH (ref 0.00–0.07)
Basophils Absolute: 0 10*3/uL (ref 0.0–0.1)
Basophils Relative: 0 %
Eosinophils Absolute: 0 10*3/uL (ref 0.0–0.5)
Eosinophils Relative: 0 %
HCT: 45.6 % (ref 39.0–52.0)
Hemoglobin: 14.8 g/dL (ref 13.0–17.0)
Immature Granulocytes: 1 %
Lymphocytes Relative: 9 %
Lymphs Abs: 1 10*3/uL (ref 0.7–4.0)
MCH: 27.2 pg (ref 26.0–34.0)
MCHC: 32.5 g/dL (ref 30.0–36.0)
MCV: 83.7 fL (ref 80.0–100.0)
Monocytes Absolute: 0.5 10*3/uL (ref 0.1–1.0)
Monocytes Relative: 5 %
Neutro Abs: 9 10*3/uL — ABNORMAL HIGH (ref 1.7–7.7)
Neutrophils Relative %: 85 %
Platelets: 273 10*3/uL (ref 150–400)
RBC: 5.45 MIL/uL (ref 4.22–5.81)
RDW: 12.5 % (ref 11.5–15.5)
WBC: 10.6 10*3/uL — ABNORMAL HIGH (ref 4.0–10.5)
nRBC: 0 % (ref 0.0–0.2)

## 2019-09-06 LAB — D-DIMER, QUANTITATIVE: D-Dimer, Quant: <0.27 ug/mL-FEU (ref 0.00–0.50)

## 2019-09-06 LAB — GLUCOSE, CAPILLARY
Glucose-Capillary: 144 mg/dL — ABNORMAL HIGH (ref 70–99)
Glucose-Capillary: 145 mg/dL — ABNORMAL HIGH (ref 70–99)
Glucose-Capillary: 152 mg/dL — ABNORMAL HIGH (ref 70–99)
Glucose-Capillary: 160 mg/dL — ABNORMAL HIGH (ref 70–99)
Glucose-Capillary: 148 mg/dL — ABNORMAL HIGH (ref 70–99)
Glucose-Capillary: 132 mg/dL — ABNORMAL HIGH (ref 70–99)
Glucose-Capillary: 129 mg/dL — ABNORMAL HIGH (ref 70–99)
Glucose-Capillary: 145 mg/dL — ABNORMAL HIGH (ref 70–99)
Glucose-Capillary: 121 mg/dL — ABNORMAL HIGH (ref 70–99)
Glucose-Capillary: 119 mg/dL — ABNORMAL HIGH (ref 70–99)
Glucose-Capillary: 120 mg/dL — ABNORMAL HIGH (ref 70–99)
Glucose-Capillary: 254 mg/dL — ABNORMAL HIGH (ref 70–99)
Glucose-Capillary: 138 mg/dL — ABNORMAL HIGH (ref 70–99)

## 2019-09-06 LAB — FERRITIN: Ferritin: 219 ng/mL (ref 24–336)

## 2019-09-06 LAB — PHOSPHORUS: Phosphorus: 4.7 mg/dL — ABNORMAL HIGH (ref 2.5–4.6)

## 2019-09-06 LAB — MAGNESIUM: Magnesium: 2.6 mg/dL — ABNORMAL HIGH (ref 1.7–2.4)

## 2019-09-06 LAB — C-REACTIVE PROTEIN: CRP: 0.9 mg/dL (ref <1.0)

## 2019-09-06 NOTE — TOC Initial Note (Signed)
Transition of Care [facility]) - Initial/Assessment Note    Patient Details  Name: Luis Simpson MRN: [id number] Date of Birth: 1985-10-31  Transition of Care [facility]) CM/SW Contact:    Elliot Gault, LCSW Phone Number: 09/06/2019, 3:12 PM  Clinical Narrative:                 Pt admitted from home and plan is for dc home when stable.  MD notified TOC that he anticipates pt will dc home tomorrow with the need for home O2. Spoke with pt's wife by phone to update. Pt's wife confirms that Jannifer Rodney is pt's current PCP. Discussed O2 providers and will refer to [facility].  Updated [facility] with [facility] and he will follow and await qualification note and orders and then arrange.  Assigned TOC will follow.  Expected Discharge Plan: Home/Self Care Barriers to Discharge: Continued Medical Work up   Patient Goals and CMS Choice Patient states their goals for this hospitalization and ongoing recovery are:: go home CMS Medicare.gov Compare Post Acute Care list provided to:: Patient Represenative (must comment) Choice offered to / list presented to : Spouse  Expected Discharge Plan and Services Expected Discharge Plan: Home/Self Care In-house Referral: Clinical Social Work   Post Acute Care Choice: Durable Medical Equipment Living arrangements for the past 2 months: Single Family Home                 DME Arranged: Oxygen DME Agency: [facility] Date DME Agency Contacted: 09/06/19   Representative spoke with at DME Agency: [facility]            Prior Living Arrangements/Services Living arrangements for the past 2 months: Single Family Home Lives with:: Spouse Patient language and need for interpreter reviewed:: Yes Do you feel safe going back to the place where you live?: Yes      Need for Family Participation in Patient Care: No (Comment) Care giver support system in place?: Yes (comment)   Criminal Activity/Legal Involvement Pertinent to Current Situation/Hospitalization: No - Comment as  needed  Activities of Daily Living Home Assistive Devices/Equipment: None ADL Screening (condition at time of admission) Patient's cognitive ability adequate to safely complete daily activities?: Yes Is the patient deaf or have difficulty hearing?: No Does the patient have difficulty seeing, even when wearing glasses/contacts?: No Does the patient have difficulty concentrating, remembering, or making decisions?: No Patient able to express need for assistance with ADLs?: Yes Does the patient have difficulty dressing or bathing?: No Independently performs ADLs?: Yes (appropriate for developmental age) Does the patient have difficulty walking or climbing stairs?: Yes Weakness of Legs: None Weakness of Arms/Hands: None  Permission Sought/Granted Permission sought to share information with : Oceanographer granted to share information with : Yes, Verbal Permission Granted     Permission granted to share info w AGENCY: [facility]        Emotional Assessment       Orientation: : Oriented to Self, Oriented to Place, Oriented to  Time, Oriented to Situation Alcohol / Substance Use: Not Applicable Psych Involvement: No (comment)  Admission diagnosis:  SOB (shortness of breath) [R06.02] COVID-19 virus infection [U07.1] Pneumonia due to COVID-19 virus [U07.1, J12.82] Patient Active Problem List   Diagnosis Date Noted  . Hyponatremia 09/03/2019  . Hypokalemia 09/03/2019  . Hypocalcemia 09/03/2019  . Transaminitis 09/03/2019  . Pneumonia due to COVID-19 virus 09/03/2019  . COVID-19 virus infection 09/02/2019  . Left knee sprain 02/26/2019  . 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  Hypertriglyceridemia 08/02/2014  . Gastroesophageal reflux disease 04/15/2014  . Obesity (BMI 30.0-34.9) 04/15/2014  . Paresthesia of both hands 04/15/2014  . Tobacco use 04/15/2014   PCP:  Junie Spencer, FNP Pharmacy:   [facility] [phone] - [place], [place] - [address] [place] [address] Phone: [phone] Fax: [phone]  [facility] [place], [place] - [address] [place] [address] Phone: [phone] Fax: [phone]     Social Determinants of Health (SDOH) Interventions    Readmission Risk Interventions Readmission Risk Prevention Plan 09/06/2019  Medication Screening Complete  Transportation Screening Complete  Some recent data might be hidden

## 2019-09-06 NOTE — Telephone Encounter (Signed)
Wife states husband will be discharged from the hospital tomorrow from having COVID and pneumonia. Needing a HFU, virtual visit this week.

## 2019-09-06 NOTE — Progress Notes (Signed)
     SATURATION QUALIFICATIONS: (This note is used to comply with regulatory documentation for home oxygen)   Patient Saturations on Room Air at Rest = 86 %   Patient Saturations on Room Air while Ambulating = 84 %   Patient Saturations on 3 Liters of oxygen while Ambulating = 93 %        Patient needs continuous O2 at 3 L/min continuously via nasal cannula with humidifier, with gaseous portability and conserving device   Shon Hale, MD

## 2019-09-06 NOTE — Progress Notes (Signed)
Patient Demographics:    Luis Simpson, is a 34 y.o. male, DOB - 03-Oct-1985, ZOX:[id number]  Admit date - 09/02/2019   Admitting Physician Frankey Shownladapo Adefeso, DO  Outpatient Primary MD for the patient is Luis Simpson, Christy A, FNP  LOS - 3   Chief Complaint  Patient presents with  . Shortness of Breath    COVID +        Subjective:    Luis LunaReginald Simpson today has No chest pain,    No further Diarrhea--  -Cough, shortness of breath persist -Oxygen requirement improving down to 3 L  -ok to transfer out of ICU--   Assessment  & Plan :    Principal Problem:   COVID-19 virus infection Active Problems:   Obesity (BMI 30.0-34.9)   Hyponatremia   Hypokalemia   Hypocalcemia   Transaminitis   Pneumonia due to COVID-19 virus  Brief Summary:- 34 y.o. male reformed smoker with past medical history relevant for GERD, obesity, and who is unvaccinated against COVID-19 infection presents with worsening shortness of breath and found to be hypoxic in the setting of positive COVID-19 test inpatient and other family members as well as CTA chest consistent with bilateral Covid pneumonia (tested positive on 08/30/2019) -Pt received casirivimab\imdevimab infusion as outpatient on 09/02/2019 prior to admission  A/p 1)Acute hypoxic respiratory failure secondary to COVID-19 infection/pneumonia---  - the treatment plan and use of medications  for treatment of COVID-19 infection and possible side effects were discussed with patient ----Patient verbalizes understanding and agrees to treatment protocols   --Patient is positive for COVID-19 infection, CTA chest with findings of infiltrates/opacities,  patient is hypoxic and requiring continuous supplemental oxygen---patient meets criteria for initiation of Remdesivir AND Decadron/Steroid therapy per protocol  -Remdesivir day # 4 PCT-- 2.0 >>1.27 Ferritin-  170>>185>>261>>227>>219 D-Dimer 0.66>>0.64>>0.62>>0.30 >> (<0.27) CRP  6.5 >> 11.7>>11.3>>2.8>> 0.9 -- trend   CRP,   CBC, BMP, d-dimer, LDH, ferritin and LFTs --Supplemental oxygen to keep O2 sats above 93%---  Oxygen requirement improving down to 3 L  overnight ---Follow serial chest x-rays and ABGs as indicated --- Encourage prone positioning for More than 16 hours/day in increments of 2 to 3 hours at a time if able to tolerate --Attempt to maintain euvolemic state --Zinc and vitamin C as ordered -Albuterol inhaler as needed -Received Decadron in the ED switched to IV Solu-Medrol 0.5 mg/kg every 12 - -Overall clinically improving, oxygen requirement also improved, inflammatory markers also improving  2)Obesity-obesity confers higher risk from a COVID-19 mobility mortality standpoint -Low calorie diet, portion control and increase physical activity discussed with patient--this can be addressed after resolution of his acute infection symptoms -Body mass index is 36.39 kg/m.   3)Hyperglycemia--- anticipate worsening glycemic control with steroids Use Novolog/Humalog Sliding scale insulin with Accu-Cheks/Fingersticks as ordered  4)HTN--elevated BP noted,  --c/n amlodipine    may use IV labetalol when necessary  Every 4 hours for systolic blood pressure over 160 mmhg   Disposition/Need for in-Hospital Stay- patient unable to be discharged at this time due to --- hypoxic respiratory failure in the setting of bilateral Covid pneumonia requiring oxygen supplementation, IV steroids and IV remdesivir  Status is: Inpatient  Remains inpatient appropriate because:hypoxic respiratory failure in the setting of bilateral Covid pneumonia requiring  oxygen supplementation, IV steroids and IV remdesivir --Anticipate discharge home on 09/07/2019 after completing 5 days of IV remdesivir  Disposition: The patient is from: Home              Anticipated d/c is to: Home              Anticipated d/c date  is: 2 days              Patient currently is not medically stable to d/c. Barriers: Not Clinically Stable- -hypoxic respiratory failure in the setting of bilateral Covid pneumonia requiring oxygen supplementation, IV steroids and IV remdesivir -Anticipate discharge home on 09/07/2019 after completing 5 days of IV remdesivir  Code Status : Full code  Family Communication:    patient is alert, awake and coherent Discussed with wife   Consults  :  na  DVT Prophylaxis  :  Lovenox   - SCDs   Lab Results  Component Value Date   PLT 273 09/06/2019    Inpatient Medications  Scheduled Meds: . albuterol  2 puff Inhalation Q6H  . amLODipine  10 mg Oral Daily  . vitamin C  1,000 mg Oral BID  . calcium-vitamin D  1 tablet Oral Q breakfast  . Chlorhexidine Gluconate Cloth  6 each Topical Daily  . dextromethorphan-guaiFENesin  1 tablet Oral BID  . enoxaparin (LOVENOX) injection  40 mg Subcutaneous Q12H  . insulin aspart  0-5 Units Subcutaneous QHS  . insulin aspart  0-6 Units Subcutaneous TID WC  . mouth rinse  15 mL Mouth Rinse BID  . methylPREDNISolone (SOLU-MEDROL) injection  60 mg Intravenous Q12H  . zinc sulfate  220 mg Oral Daily   Continuous Infusions: . remdesivir 100 mg in NS 100 mL 100 mg (09/06/19 1127)   PRN Meds:.acetaminophen, albuterol, chlorpheniramine-HYDROcodone, guaiFENesin-dextromethorphan, labetalol, ondansetron (ZOFRAN) IV, sodium chloride    Anti-infectives (From admission, onward)   Start     Dose/Rate Route Frequency Ordered Stop   09/04/19 1000  remdesivir 100 mg in sodium chloride 0.9 % 100 mL IVPB  Status:  Discontinued       "Followed by" Linked Group Details   100 mg 200 mL/hr over 30 Minutes Intravenous Daily 09/03/19 0536 09/03/19 0537   09/04/19 1000  remdesivir 100 mg in sodium chloride 0.9 % 100 mL IVPB     Discontinue    "Followed by" Linked Group Details   100 mg 200 mL/hr over 30 Minutes Intravenous Daily 09/03/19 0539 09/08/19 0959   09/03/19  0545  remdesivir 100 mg in sodium chloride 0.9 % 100 mL IVPB  Status:  Discontinued        100 mg 200 mL/hr over 30 Minutes Intravenous Every 30 min 09/03/19 0539 09/03/19 0540   09/03/19 0545  remdesivir 100 mg in sodium chloride 0.9 % 100 mL IVPB       "Followed by" Linked Group Details   100 mg 200 mL/hr over 30 Minutes Intravenous Every 30 min 09/03/19 0539 09/03/19 0729   09/03/19 0530  remdesivir 200 mg in sodium chloride 0.9% 250 mL IVPB  Status:  Discontinued       "Followed by" Linked Group Details   200 mg 580 mL/hr over 30 Minutes Intravenous Once 09/03/19 0536 09/03/19 0537        Objective:   Vitals:   09/06/19 0941 09/06/19 1000 09/06/19 1100 09/06/19 1509  BP:    139/88  Pulse: (!) 105 85 98 97  Resp: 20 (!) 22 (!) 25  20  Temp:    98 F (36.7 C)  TempSrc:    Oral  SpO2: 92% 92% 94% 91%  Weight:      Height:        Wt Readings from Last 3 Encounters:  09/06/19 105.4 kg  08/30/19 106.6 kg  06/19/19 106.6 kg     Intake/Output Summary (Last 24 hours) at 09/06/2019 1540 Last data filed at 09/05/2019 2000 Gross per 24 hour  Intake 100 ml  Output --  Net 100 ml    Physical Exam  Gen:- Awake Alert, respiratory distress, only able to speak in short sentences  HEENT:- [place].AT, No sclera icterus Nose-  3L/min Neck-Supple Neck,No JVD,.  Lungs-diminished breath sounds, few scattered wheezes  CV- S1, S2 normal, regular  Abd-  +ve B.Sounds, Abd Soft, No tenderness, increased truncal adiposity Extremity/Skin:- No  edema, pedal pulses present  Psych-affect is appropriate, oriented x3 Neuro-generalized weakness, no new focal deficits, no tremors   Data Review:   Micro Results Recent Results (from the past 240 hour(s))  Novel Coronavirus, NAA ([facility])     Status: Abnormal   Collection Time: 08/30/19  7:23 PM   Specimen: Nasal Swab; Nasopharyngeal(NP) swabs in vial transport medium   Nasopharynge  Patient  Result Value Ref Range Status   SARS-CoV-2, NAA  Detected (A) Not Detected Final    Comment: Patients who have a positive COVID-19 test result may now have treatment options. Treatment options are available for patients with mild to moderate symptoms and for hospitalized patients. Visit our website at [email or web address] for resources and information. This nucleic acid amplification test was developed and its performance characteristics determined by [employer]. Nucleic acid amplification tests include RT-PCR and TMA. This test has not been FDA cleared or approved. This test has been authorized by FDA under an Emergency Use Authorization (EUA). This test is only authorized for the duration of time the declaration that circumstances exist justifying the authorization of the emergency use of in vitro diagnostic tests for detection of SARS-CoV-2 virus and/or diagnosis of COVID-19 infection under section 564(b)(1) of the Act, 21 U.S.C. 417EYC-1(K) (1), unless the authorization is terminated or revoked sooner. When diagnostic testing is negativ e, the possibility of a false negative result should be considered in the context of a patient's recent exposures and the presence of clinical signs and symptoms consistent with COVID-19. An individual without symptoms of COVID-19 and who is not shedding SARS-CoV-2 virus would expect to have a negative (not detected) result in this assay.   SARS-COV-2, NAA 2 DAY TAT     Status: None   Collection Time: 08/30/19  7:23 PM   Nasopharynge  Patient  Result Value Ref Range Status   SARS-CoV-2, NAA 2 DAY TAT Performed  Final  Culture, group A strep     Status: None   Collection Time: 08/30/19  7:32 PM   Specimen: Throat  Result Value Ref Range Status   Specimen Description   Final    THROAT Performed at [facility], [address] [place], [place] [address]    Special Requests   Final    NONE Performed at [facility], [address] [place], [place] [address]    Culture    Final    NO GROUP A STREP (S.PYOGENES) ISOLATED Performed at [facility], [address], [place], [place] [address]    Report Status 09/03/2019 FINAL  Final  Blood Culture (routine x 2)     Status: None (Preliminary result)  Collection Time: 09/02/19  9:51 PM   Specimen: BLOOD  Result Value Ref Range Status   Specimen Description BLOOD RIGHT ANTECUBITAL  Final   Special Requests   Final    BOTTLES DRAWN AEROBIC AND ANAEROBIC Blood Culture adequate volume   Culture   Final    NO GROWTH 4 DAYS Performed at [facility], [address] [place], [place] [address]    Report Status PENDING  Incomplete  Blood Culture (routine x 2)     Status: None (Preliminary result)   Collection Time: 09/02/19  9:53 PM   Specimen: BLOOD RIGHT HAND  Result Value Ref Range Status   Specimen Description BLOOD RIGHT HAND  Final   Special Requests AEROBIC BOTTLE ONLY Blood Culture adequate volume  Final   Culture   Final    NO GROWTH 4 DAYS Performed at [facility], [address], [place], [place] [address]    Report Status PENDING  Incomplete  MRSA PCR Screening     Status: None   Collection Time: 09/03/19  8:32 AM   Specimen: Nasal Mucosa; Nasopharyngeal  Result Value Ref Range Status   MRSA by PCR NEGATIVE NEGATIVE Final    Comment:        The GeneXpert MRSA Assay (FDA approved for NASAL specimens only), is one component of a comprehensive MRSA colonization surveillance program. It is not intended to diagnose MRSA infection nor to guide or monitor treatment for MRSA infections. Performed at [facility], [address], [place], [place] [address]    Radiology Reports CT ANGIO CHEST PE W OR WO CONTRAST  Result Date: 09/03/2019 CLINICAL DATA:  34 year old male recently positive for COVID-19. Progressive shortness of breath today. EXAM: CT ANGIOGRAPHY CHEST WITH CONTRAST TECHNIQUE: Multidetector CT imaging of the chest was performed using the standard protocol during bolus  administration of intravenous contrast. Multiplanar CT image reconstructions and MIPs were obtained to evaluate the vascular anatomy. CONTRAST:  OMNIPAQUE IOHEXOL 350 MG/ML SOLN COMPARISON:  Portable chest 09/02/2019 FINDINGS: Cardiovascular: Adequate contrast bolus timing in the pulmonary arterial tree. Respiratory motion. No central or hilar pulmonary embolus. The upper and middle lobe pulmonary artery branches are fairly well delineated and appear normal. Lower lobe branch detail is more degraded by motion. Cardiac size at the upper limits of normal. No pericardial effusion. Negative visible aorta. Mediastinum/Nodes: Negative.  No lymphadenopathy. Lungs/Pleura: Major airways are patent. Widespread bilateral peribronchial and peripheral confluent pulmonary ground-glass opacity. The left upper lobe is least affected. No pleural effusion. No areas of solid lung consolidation at this time. Upper Abdomen: Possible hepatic steatosis. Otherwise negative visible liver, spleen, pancreas, left adrenal gland, bowel. Musculoskeletal: Negative. Review of the MIP images confirms the above findings. IMPRESSION: 1. No acute pulmonary embolus identified, some lower lobe branches are degraded by motion. 2. Widespread bilateral COVID-19 pneumonia. No pleural effusion. 3. Possible hepatic steatosis. Electronically Signed   By: Odessa Fleming M.D.   On: 09/03/2019 01:41   DG Chest Port 1 View  Result Date: 09/02/2019 CLINICAL DATA:  Dyspnea, COVID positive EXAM: PORTABLE CHEST 1 VIEW COMPARISON:  None. FINDINGS: Lung volumes are small. Mild left basilar atelectasis or infiltrate. No pneumothorax or pleural effusion. Cardiac size within normal limits. No acute bone abnormality. IMPRESSION: Pulmonary hypoinflation. Superimposed left basilar atelectasis or infiltrate. Electronically Signed   By: Helyn Numbers MD   On: 09/02/2019 19:01     CBC Recent Labs  Lab 09/02/19 2153 09/03/19 0543 09/04/19 7425 09/05/19 9563  09/06/19 8756  WBC 6.5 6.4 3.9* 7.8 10.6*  HGB 13.2 13.4 14.5 14.8 14.8  HCT 40.3 40.6 44.5 44.7 45.6  PLT 166 168 208 254 273  MCV 83.4 83.4 82.6 82.3 83.7  MCH 27.3 27.5 26.9 27.3 27.2  MCHC 32.8 33.0 32.6 33.1 32.5  RDW 12.9 13.1 12.9 12.5 12.5  LYMPHSABS 0.9 0.9 0.6* 0.8 1.0  MONOABS 0.2 0.2 0.2 0.3 0.5  EOSABS 0.0 0.0 0.0 0.0 0.0  BASOSABS 0.0 0.0 0.0 0.0 0.0    Chemistries  Recent Labs  Lab 09/02/19 2153 09/03/19 0543 09/04/19 0513 09/05/19 0514 09/06/19 0536  NA 134* 134* 137 137 136  K 3.1* 3.6 3.7 4.1 4.0  CL 105 103 102 102 100  CO2 19* 22 23 25 25   GLUCOSE 109* 126* 161* 139* 131*  BUN 12 10 14 17 20   CREATININE 0.87 0.84 0.78 0.74 0.68  CALCIUM 7.8* 8.4* 8.9 8.7* 8.5*  MG  --  1.9 2.3 2.5* 2.6*  AST 46* 47* 42* 36 59*  ALT 54* 54* 55* 55* 90*  ALKPHOS 55 57 54 52 51  BILITOT 0.4 0.3 0.5 0.5 0.7   ------------------------------------------------------------------------------------------------------------------ No results for input(s): CHOL, HDL, LDLCALC, TRIG, CHOLHDL, LDLDIRECT in the last 72 hours.  Lab Results  Component Value Date   HGBA1C 5.9 (H) 09/05/2019   ------------------------------------------------------------------------------------------------------------------ No results for input(s): TSH, T4TOTAL, T3FREE, THYROIDAB in the last 72 hours.  Invalid input(s): FREET3 ------------------------------------------------------------------------------------------------------------------ Recent Labs    09/05/19 0514 09/06/19 0536  FERRITIN 227 219    Coagulation profile No results for input(s): INR, PROTIME in the last 168 hours.  Recent Labs    09/05/19 0514 09/06/19 0536  DDIMER 0.30 <0.27    Cardiac Enzymes No results for input(s): CKMB, TROPONINI, MYOGLOBIN in the last 168 hours.  Invalid input(s): CK ------------------------------------------------------------------------------------------------------------------ No results  found for: BNP   09/07/19 M.D on 09/06/2019 at 3:40 PM  Go to [email or web address] - for contact info  [facility] - Office  [phone]

## 2019-09-07 LAB — CBC WITH DIFFERENTIAL/PLATELET
Abs Immature Granulocytes: 0.28 10*3/uL — ABNORMAL HIGH (ref 0.00–0.07)
Basophils Absolute: 0 10*3/uL (ref 0.0–0.1)
Basophils Relative: 0 %
Eosinophils Absolute: 0 10*3/uL (ref 0.0–0.5)
Eosinophils Relative: 0 %
HCT: 45.4 % (ref 39.0–52.0)
Hemoglobin: 15.1 g/dL (ref 13.0–17.0)
Immature Granulocytes: 3 %
Lymphocytes Relative: 10 %
Lymphs Abs: 1.1 10*3/uL (ref 0.7–4.0)
MCH: 27.4 pg (ref 26.0–34.0)
MCHC: 33.3 g/dL (ref 30.0–36.0)
MCV: 82.2 fL (ref 80.0–100.0)
Monocytes Absolute: 0.6 10*3/uL (ref 0.1–1.0)
Monocytes Relative: 5 %
Neutro Abs: 9.1 10*3/uL — ABNORMAL HIGH (ref 1.7–7.7)
Neutrophils Relative %: 82 %
Platelets: 300 10*3/uL (ref 150–400)
RBC: 5.52 MIL/uL (ref 4.22–5.81)
RDW: 12.3 % (ref 11.5–15.5)
WBC: 11.1 10*3/uL — ABNORMAL HIGH (ref 4.0–10.5)
nRBC: 0 % (ref 0.0–0.2)

## 2019-09-07 LAB — COMPREHENSIVE METABOLIC PANEL
ALT: 89 U/L — ABNORMAL HIGH (ref 0–44)
AST: 37 U/L (ref 15–41)
Albumin: 3.4 g/dL — ABNORMAL LOW (ref 3.5–5.0)
Alkaline Phosphatase: 54 U/L (ref 38–126)
Anion gap: 10 (ref 5–15)
BUN: 20 mg/dL (ref 6–20)
CO2: 26 mmol/L (ref 22–32)
Calcium: 8.5 mg/dL — ABNORMAL LOW (ref 8.9–10.3)
Chloride: 101 mmol/L (ref 98–111)
Creatinine, Ser: 0.69 mg/dL (ref 0.61–1.24)
GFR calc Af Amer: >60 mL/min (ref >60)
GFR calc non Af Amer: >60 mL/min (ref >60)
Glucose, Bld: 122 mg/dL — ABNORMAL HIGH (ref 70–99)
Potassium: 4.1 mmol/L (ref 3.5–5.1)
Sodium: 137 mmol/L (ref 135–145)
Total Bilirubin: 0.7 mg/dL (ref 0.3–1.2)
Total Protein: 6.9 g/dL (ref 6.5–8.1)

## 2019-09-07 LAB — CULTURE, BLOOD (ROUTINE X 2)
Culture: NO GROWTH
Culture: NO GROWTH
Special Requests: ADEQUATE
Special Requests: ADEQUATE

## 2019-09-07 LAB — FERRITIN: Ferritin: 213 ng/mL (ref 24–336)

## 2019-09-07 LAB — MAGNESIUM: Magnesium: 2.7 mg/dL — ABNORMAL HIGH (ref 1.7–2.4)

## 2019-09-07 LAB — C-REACTIVE PROTEIN: CRP: 0.6 mg/dL (ref <1.0)

## 2019-09-07 LAB — D-DIMER, QUANTITATIVE: D-Dimer, Quant: <0.27 ug/mL-FEU (ref 0.00–0.50)

## 2019-09-07 LAB — PHOSPHORUS: Phosphorus: 5 mg/dL — ABNORMAL HIGH (ref 2.5–4.6)

## 2019-09-07 MED ORDER — DEXAMETHASONE 4 MG PO TABS: 6.0000 mg | ORAL_TABLET | Freq: Every day | ORAL | Status: DC

## 2019-09-07 MED ORDER — ACETAMINOPHEN 325 MG PO TABS: 650.0000 mg | ORAL_TABLET | Freq: Four times a day (QID) | ORAL | 0 refills | Status: DC | PRN

## 2019-09-07 MED ORDER — AMLODIPINE BESYLATE 2.5 MG PO TABS: 2.5000 mg | ORAL_TABLET | Freq: Every day | ORAL | 1 refills | Status: DC

## 2019-09-07 MED ORDER — SALINE SPRAY 0.65 % NA SOLN: 1.0000 | NASAL | 0 refills | Status: DC | PRN

## 2019-09-07 MED ORDER — CALCIUM CARBONATE-VITAMIN D 500-200 MG-UNIT PO TABS: 1.0000 | ORAL_TABLET | Freq: Every day | ORAL | 0 refills | Status: DC

## 2019-09-07 MED ORDER — GUAIFENESIN ER 600 MG PO TB12
600.0000 mg | ORAL_TABLET | Freq: Two times a day (BID) | ORAL | 0 refills | Status: AC
Start: 2019-09-07 — End: 2019-09-17

## 2019-09-07 MED ORDER — ALBUTEROL SULFATE HFA 108 (90 BASE) MCG/ACT IN AERS: 2.0000 | INHALATION_SPRAY | RESPIRATORY_TRACT | 0 refills | Status: DC | PRN

## 2019-09-07 MED ORDER — AMLODIPINE BESYLATE 5 MG PO TABS: 2.5000 mg | ORAL_TABLET | Freq: Every day | ORAL | Status: DC

## 2019-09-07 MED ORDER — DEXAMETHASONE 6 MG PO TABS: 6.0000 mg | ORAL_TABLET | Freq: Every day | ORAL | 0 refills | Status: AC

## 2019-09-07 MED ORDER — ASCORBIC ACID 1000 MG PO TABS: 1000.0000 mg | ORAL_TABLET | Freq: Two times a day (BID) | ORAL | 2 refills | Status: DC

## 2019-09-07 MED ORDER — ZINC SULFATE 220 (50 ZN) MG PO CAPS: 220.0000 mg | ORAL_CAPSULE | Freq: Every day | ORAL | 1 refills | Status: DC

## 2019-09-07 NOTE — Discharge Instructions (Signed)
1) You are strongly advised to  to isolate/quarantine for at least 21 days from the date of your diagnoses with COVID-19 infection--you isolation is to go through August 3rd, 2021 inclusive, please isolate longer if you have fevers ---please always wear a mask if you have to go outside the house---  2)you need oxygen at home at 3 L via nasal cannula continuously while awake and while asleep--- smoking or having open fires around oxygen can cause fire, significant injury and death  3)Avoid ibuprofen/Advil/Aleve/Motrin/Goody Powders/Naproxen/BC powders/Meloxicam/Diclofenac/Indomethacin and other Nonsteroidal anti-inflammatory medications as these will make you more likely to bleed and can cause stomach ulcers, can also cause Kidney problems.   4) outpatient follow-up with pulmonologist advised--- sometime in September after you have recovered from Covid to try to set up sleep study for possible sleep apnea  5)Virtual follow-up visit with PCP on 09/10/2019 for reevaluation due to Covid pneumonia and acute hypoxic respiratory failure requiring continuous oxygen at 3 L/min continuously  5)Please avoid being sedentary, please continue to move around to reduce your risk for blood clots

## 2019-09-07 NOTE — Progress Notes (Signed)
Patient discharged off unit via wheelchair with oxygen equipment (two oxygen tanks and portable oxygen machine).

## 2019-09-07 NOTE — Discharge Summary (Signed)
Luis Simpson, is a 34 y.o. male  DOB June 16, 1985  MRN [id number].  Admission date:  09/02/2019  Admitting Physician  Frankey Shown, DO  Discharge Date:  09/07/2019   Primary MD  Junie Spencer, FNP  Recommendations for primary care physician for things to follow:  1) You are strongly advised to  to isolate/quarantine for at least 21 days from the date of your diagnoses with COVID-19 infection--you isolation is to go through August 3rd, 2021 inclusive, please isolate longer if you have fevers ---please always wear a mask if you have to go outside the house---  2)you need oxygen at home at 3 L via nasal cannula continuously while awake and while asleep--- smoking or having open fires around oxygen can cause fire, significant injury and death  3)Avoid ibuprofen/Advil/Aleve/Motrin/Goody Powders/Naproxen/BC powders/Meloxicam/Diclofenac/Indomethacin and other Nonsteroidal anti-inflammatory medications as these will make you more likely to bleed and can cause stomach ulcers, can also cause Kidney problems.   4) outpatient follow-up with pulmonologist advised--- sometime in September after you have recovered from Covid to try to set up sleep study for possible sleep apnea  5)Virtual follow-up visit with PCP on 09/10/2019 for reevaluation due to Covid pneumonia and acute hypoxic respiratory failure requiring continuous oxygen at 3 L/min continuously  Admission Diagnosis  SOB (shortness of breath) [R06.02] COVID-19 virus infection [U07.1] Pneumonia due to COVID-19 virus [U07.1, J12.82]   Discharge Diagnosis  SOB (shortness of breath) [R06.02] COVID-19 virus infection [U07.1] Pneumonia due to COVID-19 virus [U07.1, J12.82]   Principal Problem:   COVID-19 virus infection Active Problems:   Obesity (BMI 30.0-34.9)   Hyponatremia   Hypokalemia   Hypocalcemia   Transaminitis   Pneumonia due to COVID-19  virus      Past Medical History:  Diagnosis Date  . Constipation     Past Surgical History:  Procedure Laterality Date  . NASAL SEPTOPLASTY W/ TURBINOPLASTY Bilateral 06/08/2019   Procedure: NASAL SEPTOPLASTY WITH  BILATERAL TURBINATE REDUCTION;  Surgeon: Newman Pies, MD;  Location: [facility];  Service: ENT;  Laterality: Bilateral;  . WISDOM TOOTH EXTRACTION       HPI  from the history and physical done on the day of admission:    Chief Complaint: Shortness of breath  HPI: Luis Simpson is a 34 y.o. male with medical history significant for COVID-19 virus infection, GERD, obesity who presents to the emergency department due to shortness of breath.  Patient presented to an urgent care about 4 days ago with complaint of generalized body aches, fever, chills, sore throat, loss of taste and smell for 2 to 3 days prior to presentation to the urgent care, he was treated with Tessalon, prednisone, Flonase and cetirizine and patient was screened for COVID-19 which returned positive and was asked to self isolate himself at home for about 10 days from symptom onset and for greater than 24 hours after symptom resolution.  He was at [facility] today and had infusion therapy with a monoclonal antibody and a complaint of  increased shortness of breath which worsens on ambulation, patient states that he has been taking shallow breaths for several days due to midsternal and nonradiating chest pain (worsens on taking deep breath).  Patient has not had any of the COVID-19 viral vaccines.  He denies nausea, vomiting or diarrhea.  ED Course:  In the emergency department, he was tachycardic, tachypneic and was transitorily febrile but this improved with Tylenol.  However, patient continued to maintain O2 sat from 91% to 99% on room air.  It was noted that HR increases so 140s and RR increases to 30s on ambulation and was with increased work of breathing despite patient still been able to maintain  normal O2 sat.  Work-up in the ED showed normal CBC, CMP showed mild hyponatremia hypokalemia, hypocalcemia, mild transaminitis.  CRP 6.5, LDH 239, D-dimer 0.66, fibrinogen 555, lactic acid was normal, procalcitonin was normal.  Chest x-ray showed pulmonary hyperinflation with superimposed left basilar atelectasis or infiltrate noted.  CT angiography of chest showed no acute pulmonary embolus, but showed widespread bilateral COVID-19 pneumonia.  Hospitalist was asked to admit patient for further evaluation and management.   Hospital Course:   Brief Summary:- 34 y.o.male reformed smoker with past medical history relevant for GERD, obesity, and who is unvaccinated against COVID-19 infection presents with worsening shortness of breath and found to be hypoxic in the setting of positive COVID-19 test inpatient and other family members as well as CTA chest consistent with bilateral Covid pneumonia (tested positive on 08/30/2019) -Pt received casirivimab\imdevimab infusion as outpatient on 09/02/2019 prior to admission  A/p 1)Acute hypoxic respiratory failure secondary to COVID-19 infection/pneumonia---  - --Patient is positive for COVID-19 infection, CTA chest with findings of infiltrates/opacities,  patient is hypoxic and requiring continuous supplemental oxygen---patient meets criteria for initiation of Remdesivir AND Decadron/Steroid therapy per protocol  -Patient completed 5 days of IV remdesivir PCT-- 2.0 >>1.27 Ferritin- 170>>185>>261>>227>>219>> 213 D-Dimer 0.66>>0.64>>0.62>>0.30 >> (<0.27) CRP  6.5 >> 11.7>>11.3>>2.8>> 0.9>>0.6 ----Supplemental oxygen to keep O2 sats above 93%---  Oxygen requirement improving down to 3 L  overnight --- Encourage prone positioning  as much as possible --Attempt to maintain euvolemic state --Zinc and vitamin C as ordered -Albuterol inhaler as needed -Completed 5 days of IV Solu-Medrol okay to discharge home on p.o. Decadron - -Overall clinically improving,  oxygen requirement also improved, inflammatory markers also improving  2)Obesity-obesity confers higher risk from a COVID-19 mobility mortality standpoint -Low calorie diet, portion control and increase physical activity discussed with patient--this can be addressed after resolution of his acute infection symptoms -Body mass index is 36.39 kg/m.   3)Hyperglycemia--- improved with steroid taper  4)HTN-improved with steroid taper,  --discharged on amlodipine 2.5 mg daily  Disposition--- discharge home with instructions to quarantine per protocol  Code Status : Full code  Family Communication:    patient is alert, awake and coherent Discussed with wife   Consults  :  na   Discharge Condition: stable  Follow UP   Follow-up Information    Junie Spencer, FNP. Schedule an appointment as soon as possible for a visit in 3 day(s).   Specialty: Family Medicine Why: Virtual follow-up visit for reevaluation due to Covid pneumonia and acute hypoxic respiratory failure requiring continuous oxygen at 3 L/min continuously Contact information: [address] [place] [address] [phone]               consults obtained -   Diet and Activity recommendation:  As advised  Discharge Instructions  Discharge Instructions    Call MD for:  difficulty breathing, headache or visual disturbances   Complete by: As directed    Call MD for:  persistant dizziness or light-headedness   Complete by: As directed    Call MD for:  persistant nausea and vomiting   Complete by: As directed    Call MD for:  severe uncontrolled pain   Complete by: As directed    Call MD for:  temperature >100.4   Complete by: As directed    Diet - low sodium heart healthy   Complete by: As directed    Discharge instructions   Complete by: As directed    1) You are strongly advised to  to isolate/quarantine for at least 21 days from the date of your diagnoses with COVID-19 infection--you  isolation is to go through August 3rd, 2021 inclusive, please isolate longer if you have fevers ---please always wear a mask if you have to go outside the house---  2)you need oxygen at home at 3 L via nasal cannula continuously while awake and while asleep--- smoking or having open fires around oxygen can cause fire, significant injury and death  3)Avoid ibuprofen/Advil/Aleve/Motrin/Goody Powders/Naproxen/BC powders/Meloxicam/Diclofenac/Indomethacin and other Nonsteroidal anti-inflammatory medications as these will make you more likely to bleed and can cause stomach ulcers, can also cause Kidney problems.   4) outpatient follow-up with pulmonologist advised--- sometime in September after you have recovered from Covid to try to set up sleep study for possible sleep apnea  5)Please avoid being sedentary, please continue to move around to reduce your risk for blood clots   Increase activity slowly   Complete by: As directed         Discharge Medications     Allergies as of 09/07/2019   No Known Allergies     Medication List    STOP taking these medications   benzonatate 100 MG capsule Commonly known as: TESSALON   ibuprofen 200 MG tablet Commonly known as: ADVIL   predniSONE 10 MG tablet Commonly known as: DELTASONE     TAKE these medications   acetaminophen 325 MG tablet Commonly known as: TYLENOL Take 2 tablets (650 mg total) by mouth every 6 (six) hours as needed for mild pain or headache (fever >/= 101).   albuterol 108 (90 Base) MCG/ACT inhaler Commonly known as: VENTOLIN HFA Inhale 2 puffs into the lungs every 4 (four) hours as needed for wheezing or shortness of breath.   amLODipine 2.5 MG tablet Commonly known as: NORVASC Take 1 tablet (2.5 mg total) by mouth daily. For BP Start taking on: September 08, 2019   ascorbic acid 1000 MG tablet Commonly known as: VITAMIN C Take 1 tablet (1,000 mg total) by mouth 2 (two) times daily.   calcium-vitamin D 500-200 MG-UNIT  tablet Commonly known as: OSCAL WITH D Take 1 tablet by mouth daily with breakfast. Start taking on: September 08, 2019   cetirizine 10 MG tablet Commonly known as: ZyrTEC Allergy Take 1 tablet (10 mg total) by mouth daily.   cholecalciferol 25 MCG (1000 UNIT) tablet Commonly known as: VITAMIN D3 Take 2,000 Units by mouth daily.   dexamethasone 6 MG tablet Commonly known as: DECADRON Take 1 tablet (6 mg total) by mouth daily for 5 days. Please take with food Start taking on: September 08, 2019   fluticasone 50 MCG/ACT nasal spray Commonly known as: FLONASE Place 1 spray into both nostrils daily for 14 days.   guaiFENesin 600 MG 12 hr tablet  Commonly known as: Mucinex Take 1 tablet (600 mg total) by mouth 2 (two) times daily for 10 days.   multivitamin capsule Take 1 capsule by mouth daily.   sodium chloride 0.65 % Soln nasal spray Commonly known as: OCEAN Place 1 spray into both nostrils as needed for congestion.   zinc sulfate 220 (50 Zn) MG capsule Take 1 capsule (220 mg total) by mouth daily. Start taking on: September 08, 2019            Durable Medical Equipment  (From admission, onward)         Start     Ordered   09/06/19 1557  For home use only DME oxygen  Once       Comments: SATURATION QUALIFICATIONS: (This note is used to comply with regulatory documentation for home oxygen)   Patient Saturations on Room Air at Rest = 86 %   Patient Saturations on Room Air while Ambulating = 84 %   Patient Saturations on 3 Liters of oxygen while Ambulating = 93 %        Patient needs continuous O2 at 3 L/min continuously via nasal cannula with humidifier, with gaseous portability and conserving device  Question Answer Comment  Length of Need 6 Months   Mode or (Route) Nasal cannula   Liters per Minute 3   Frequency Continuous (stationary and portable oxygen unit needed)   Oxygen conserving device Yes   Oxygen delivery system Gas      09/06/19 1556         Major  procedures and Radiology Reports - PLEASE review detailed and final reports for all details, in brief -   CT ANGIO CHEST PE W OR WO CONTRAST  Result Date: 09/03/2019 CLINICAL DATA:  35 year old male recently positive for COVID-19. Progressive shortness of breath today. EXAM: CT ANGIOGRAPHY CHEST WITH CONTRAST TECHNIQUE: Multidetector CT imaging of the chest was performed using the standard protocol during bolus administration of intravenous contrast. Multiplanar CT image reconstructions and MIPs were obtained to evaluate the vascular anatomy. CONTRAST:  OMNIPAQUE IOHEXOL 350 MG/ML SOLN COMPARISON:  Portable chest 09/02/2019 FINDINGS: Cardiovascular: Adequate contrast bolus timing in the pulmonary arterial tree. Respiratory motion. No central or hilar pulmonary embolus. The upper and middle lobe pulmonary artery branches are fairly well delineated and appear normal. Lower lobe branch detail is more degraded by motion. Cardiac size at the upper limits of normal. No pericardial effusion. Negative visible aorta. Mediastinum/Nodes: Negative.  No lymphadenopathy. Lungs/Pleura: Major airways are patent. Widespread bilateral peribronchial and peripheral confluent pulmonary ground-glass opacity. The left upper lobe is least affected. No pleural effusion. No areas of solid lung consolidation at this time. Upper Abdomen: Possible hepatic steatosis. Otherwise negative visible liver, spleen, pancreas, left adrenal gland, bowel. Musculoskeletal: Negative. Review of the MIP images confirms the above findings. IMPRESSION: 1. No acute pulmonary embolus identified, some lower lobe branches are degraded by motion. 2. Widespread bilateral COVID-19 pneumonia. No pleural effusion. 3. Possible hepatic steatosis. Electronically Signed   By: Odessa Fleming M.D.   On: 09/03/2019 01:41   DG Chest Port 1 View  Result Date: 09/02/2019 CLINICAL DATA:  Dyspnea, COVID positive EXAM: PORTABLE CHEST 1 VIEW COMPARISON:  None. FINDINGS: Lung  volumes are small. Mild left basilar atelectasis or infiltrate. No pneumothorax or pleural effusion. Cardiac size within normal limits. No acute bone abnormality. IMPRESSION: Pulmonary hypoinflation. Superimposed left basilar atelectasis or infiltrate. Electronically Signed   By: Helyn Numbers MD   On: 09/02/2019 19:01  Micro Results  Recent Results (from the past 240 hour(s))  Novel Coronavirus, NAA ([facility])     Status: Abnormal   Collection Time: 08/30/19  7:23 PM   Specimen: Nasal Swab; Nasopharyngeal(NP) swabs in vial transport medium   Nasopharynge  Patient  Result Value Ref Range Status   SARS-CoV-2, NAA Detected (A) Not Detected Final    Comment: Patients who have a positive COVID-19 test result may now have treatment options. Treatment options are available for patients with mild to moderate symptoms and for hospitalized patients. Visit our website at [email or web address] for resources and information. This nucleic acid amplification test was developed and its performance characteristics determined by [employer]. Nucleic acid amplification tests include RT-PCR and TMA. This test has not been FDA cleared or approved. This test has been authorized by FDA under an Emergency Use Authorization (EUA). This test is only authorized for the duration of time the declaration that circumstances exist justifying the authorization of the emergency use of in vitro diagnostic tests for detection of SARS-CoV-2 virus and/or diagnosis of COVID-19 infection under section 564(b)(1) of the Act, 21 U.S.C. 098JXB-1(Y) (1), unless the authorization is terminated or revoked sooner. When diagnostic testing is negativ e, the possibility of a false negative result should be considered in the context of a patient's recent exposures and the presence of clinical signs and symptoms consistent with COVID-19. An individual without symptoms of COVID-19 and who is not shedding SARS-CoV-2  virus would expect to have a negative (not detected) result in this assay.   SARS-COV-2, NAA 2 DAY TAT     Status: None   Collection Time: 08/30/19  7:23 PM   Nasopharynge  Patient  Result Value Ref Range Status   SARS-CoV-2, NAA 2 DAY TAT Performed  Final  Culture, group A strep     Status: None   Collection Time: 08/30/19  7:32 PM   Specimen: Throat  Result Value Ref Range Status   Specimen Description   Final    THROAT Performed at [facility], [address] [place], [place] [address]    Special Requests   Final    NONE Performed at [facility], [address], [place], [place] [address]    Culture   Final    NO GROUP A STREP (S.PYOGENES) ISOLATED Performed at [facility], [address], [place], [place] [address]    Report Status 09/03/2019 FINAL  Final  Blood Culture (routine x 2)     Status: None   Collection Time: 09/02/19  9:51 PM   Specimen: BLOOD  Result Value Ref Range Status   Specimen Description BLOOD RIGHT ANTECUBITAL  Final   Special Requests   Final    BOTTLES DRAWN AEROBIC AND ANAEROBIC Blood Culture adequate volume   Culture   Final    NO GROWTH 5 DAYS Performed at [facility], [address] [place], [place] [address]    Report Status 09/07/2019 FINAL  Final  Blood Culture (routine x 2)     Status: None   Collection Time: 09/02/19  9:53 PM   Specimen: BLOOD RIGHT HAND  Result Value Ref Range Status   Specimen Description BLOOD RIGHT HAND  Final   Special Requests AEROBIC BOTTLE ONLY Blood Culture adequate volume  Final   Culture   Final    NO GROWTH 5 DAYS Performed at [facility], [address], [place], [place] [address]    Report Status 09/07/2019 FINAL  Final  MRSA PCR Screening  Status: None   Collection Time: 09/03/19  8:32 AM   Specimen: Nasal Mucosa; Nasopharyngeal  Result Value Ref Range Status   MRSA by PCR NEGATIVE NEGATIVE Final    Comment:        The GeneXpert MRSA Assay (FDA approved for NASAL specimens only), is  one component of a comprehensive MRSA colonization surveillance program. It is not intended to diagnose MRSA infection nor to guide or monitor treatment for MRSA infections. Performed at [facility], [address] [place], [place] [id number]        Today   Subjective    Luis Simpson today has no new complaints No fever  Or chills   No Nausea, Vomiting or Diarrhea         Patient has been seen and examined prior to discharge   Objective   Blood pressure 111/75, pulse 73, temperature 97.9 F (36.6 C), temperature source Oral, resp. rate 19, height 5\' 7"  (1.702 m), weight 105.4 kg, SpO2 96 %.  No intake or output data in the 24 hours ending 09/07/19 1433  Exam Gen:- Awake Alert, no acute distress , speaking in sentences HEENT:- [place].AT, No sclera icterus Nose- [place] 3L/min Neck-Supple Neck,No JVD,.  Lungs-improving air movement, no wheezing CV- S1, S2 normal, regular Abd-  +ve B.Sounds, Abd Soft, No tenderness,    Extremity/Skin:- No  edema,   good pulses Psych-affect is appropriate, oriented x3 Neuro-no new focal deficits, no tremors    Data Review   CBC w Diff:  Lab Results  Component Value Date   WBC 11.1 (H) 09/07/2019   HGB 15.1 09/07/2019   HCT 45.4 09/07/2019   PLT 300 09/07/2019   LYMPHOPCT 10 09/07/2019   MONOPCT 5 09/07/2019   EOSPCT 0 09/07/2019   BASOPCT 0 09/07/2019    CMP:  Lab Results  Component Value Date   NA 137 09/07/2019   K 4.1 09/07/2019   CL 101 09/07/2019   CO2 26 09/07/2019   BUN 20 09/07/2019   CREATININE 0.69 09/07/2019   PROT 6.9 09/07/2019   ALBUMIN 3.4 (L) 09/07/2019   BILITOT 0.7 09/07/2019   ALKPHOS 54 09/07/2019   AST 37 09/07/2019   ALT 89 (H) 09/07/2019  .   Total Discharge time is about 33 minutes  Shon Haleourage Kahli Mayon M.D on 09/07/2019 at 2:33 PM  Go to [email or web address] -  for contact info  [facility] - Office  [phone]

## 2019-09-07 NOTE — Telephone Encounter (Signed)
Virtual hospital follow up scheduled for 09/10/19 with B. Alona Bene

## 2019-09-07 NOTE — Progress Notes (Signed)
IV removed, AVS went over with patient, he has no questions or concerns.

## 2019-09-07 NOTE — Progress Notes (Signed)
Patient's oxygen equipment delivered by [employer]. Instructions went over with patient and form signed. Copy given. He has no questions.

## 2019-09-10 ENCOUNTER — Telehealth (INDEPENDENT_AMBULATORY_CARE_PROVIDER_SITE_OTHER): Payer: BLUE CROSS/BLUE SHIELD | Admitting: Family Medicine

## 2019-09-10 DIAGNOSIS — U071 COVID-19: Secondary | ICD-10-CM | POA: Diagnosis not present

## 2019-09-10 DIAGNOSIS — J1282 Pneumonia due to coronavirus disease 2019: Secondary | ICD-10-CM

## 2019-09-10 NOTE — Progress Notes (Signed)
Virtual Visit via Video note  I connected with Marcelo Baldy on 09/11/19 at 11:35 AM by video and verified that I am speaking with the correct person using two identifiers. Luis Simpson is currently located at home and his wife is currently with him during visit. The provider, Gwenlyn Fudge, FNP is located in their office at time of visit.  I discussed the limitations, risks, security and privacy concerns of performing an evaluation and management service by video and the availability of in person appointments. I also discussed with the patient that there may be a patient responsible charge related to this service. The patient expressed understanding and agreed to proceed.  Subjective: PCP: Junie Spencer, FNP  Chief Complaint  Patient presents with  . Hospitalization Follow-up   Patient was hospitalized at [facility] from 09/02/19 - 09/07/2019 due to COVID-19 infection and pneumonia due to COVID-19. Patient is under quarantine until August 3rd. He was discharged home with 3L of oxygen via nasal cannula. He does have a pulse oximeter where he can monitor his oxygen saturation. He reports he wears the oxygen all the time except while showering and occasionally he takes it off for about an hour. He does monitor his oxygen level closely, especially when the oxygen is off. He reports the lowest oxygen saturation he has seen is 89%. Also states he can tell when he needs to put it back on. He does feel he is improving but still has little energy.    ROS: Per HPI  Current Outpatient Medications:  .  acetaminophen (TYLENOL) 325 MG tablet, Take 2 tablets (650 mg total) by mouth every 6 (six) hours as needed for mild pain or headache (fever >/= 101)., Disp: 12 tablet, Rfl: 0 .  albuterol (VENTOLIN HFA) 108 (90 Base) MCG/ACT inhaler, Inhale 2 puffs into the lungs every 4 (four) hours as needed for wheezing or shortness of breath., Disp: 18 g, Rfl: 0 .  amLODipine (NORVASC) 2.5 MG  tablet, Take 1 tablet (2.5 mg total) by mouth daily. For BP, Disp: 30 tablet, Rfl: 1 .  ascorbic acid (VITAMIN C) 1000 MG tablet, Take 1 tablet (1,000 mg total) by mouth 2 (two) times daily., Disp: 30 tablet, Rfl: 2 .  calcium-vitamin D (OSCAL WITH D) 500-200 MG-UNIT tablet, Take 1 tablet by mouth daily with breakfast., Disp: 30 tablet, Rfl: 0 .  cetirizine (ZYRTEC ALLERGY) 10 MG tablet, Take 1 tablet (10 mg total) by mouth daily., Disp: 30 tablet, Rfl: 0 .  cholecalciferol (VITAMIN D3) 25 MCG (1000 UNIT) tablet, Take 2,000 Units by mouth daily., Disp: , Rfl:  .  dexamethasone (DECADRON) 6 MG tablet, Take 1 tablet (6 mg total) by mouth daily for 5 days. Please take with food, Disp: 5 tablet, Rfl: 0 .  fluticasone (FLONASE) 50 MCG/ACT nasal spray, Place 1 spray into both nostrils daily for 14 days., Disp: 16 g, Rfl: 0 .  guaiFENesin (MUCINEX) 600 MG 12 hr tablet, Take 1 tablet (600 mg total) by mouth 2 (two) times daily for 10 days., Disp: 20 tablet, Rfl: 0 .  Multiple Vitamin (MULTIVITAMIN) capsule, Take 1 capsule by mouth daily., Disp: , Rfl:  .  sodium chloride (OCEAN) 0.65 % SOLN nasal spray, Place 1 spray into both nostrils as needed for congestion., Disp: 30 mL, Rfl: 0 .  zinc sulfate 220 (50 Zn) MG capsule, Take 1 capsule (220 mg total) by mouth daily., Disp: 30 capsule, Rfl: 1  No Known Allergies Past  Medical History:  Diagnosis Date  . Constipation     Observations/Objective: Vitals:   09/11/19 1326  SpO2: 95%   Physical Exam Constitutional:      General: He is not in acute distress.    Appearance: Normal appearance. He is not ill-appearing or toxic-appearing.  Eyes:     General: No scleral icterus.       Right eye: No discharge.        Left eye: No discharge.     Conjunctiva/sclera: Conjunctivae normal.  Pulmonary:     Effort: Pulmonary effort is normal. No respiratory distress.  Neurological:     Mental Status: He is alert and oriented to person, place, and time.    Psychiatric:        Mood and Affect: Mood normal.        Behavior: Behavior normal.        Thought Content: Thought content normal.        Judgment: Judgment normal.    Assessment and Plan: 1-2. COVID-19 virus infection/Pneumonia due to COVID-19 virus - Improving. Patient to remain in quarantine until August 3rd. Discussed as he continues to improve he can start weaning off the oxygen by 0.5 L at a time. He understands to keep an eye on his oxygen saturation and keep it > 90%.    Follow Up Instructions: Return in about 3 weeks (around 10/01/2019) for f/u COVID with repeat CXR?.   I discussed the assessment and treatment plan with the patient. The patient was provided an opportunity to ask questions and all were answered. The patient agreed with the plan and demonstrated an understanding of the instructions.   The patient was advised to call back or seek an in-person evaluation if the symptoms worsen or if the condition fails to improve as anticipated.  The above assessment and management plan was discussed with the patient. The patient verbalized understanding of and has agreed to the management plan. Patient is aware to call the clinic if symptoms persist or worsen. Patient is aware when to return to the clinic for a follow-up visit. Patient educated on when it is appropriate to go to the emergency department.   Time call ended: 11:44 AM  I provided 11 minutes of face-to-face time during this encounter.   Deliah Boston, MSN, APRN, FNP-C [facility] 09/11/19

## 2019-09-11 ENCOUNTER — Encounter: Payer: Self-pay | Admitting: Family Medicine

## 2019-09-20 ENCOUNTER — Telehealth: Payer: Self-pay | Admitting: Family

## 2019-09-20 NOTE — Telephone Encounter (Signed)
Patient aware.

## 2019-09-20 NOTE — Telephone Encounter (Signed)
Work note extended and sent to [employer].

## 2019-10-04 DIAGNOSIS — Z0289 Encounter for other administrative examinations: Secondary | ICD-10-CM

## 2020-02-10 ENCOUNTER — Ambulatory Visit
Admission: EM | Admit: 2020-02-10 | Discharge: 2020-02-10 | Disposition: A | Discharge status: Home / Self Care | Payer: BLUE CROSS/BLUE SHIELD | Attending: Emergency Medicine | Admitting: Emergency Medicine

## 2020-02-10 ENCOUNTER — Other Ambulatory Visit: Payer: Self-pay

## 2020-02-10 ENCOUNTER — Encounter: Payer: Self-pay | Admitting: Emergency Medicine

## 2020-02-10 DIAGNOSIS — R21 Rash and other nonspecific skin eruption: Secondary | ICD-10-CM

## 2020-02-10 MED ORDER — TRIAMCINOLONE ACETONIDE 0.1 % EX CREA
1.0000 | TOPICAL_CREAM | Freq: Two times a day (BID) | CUTANEOUS | 0 refills | Status: DC
Start: 2020-02-10 — End: 2022-06-06

## 2020-02-10 NOTE — ED Triage Notes (Signed)
Pt said he has had a dry irritated area on the head of his penis since august, not worried about sexual disease. Pt said the place now is red. Does not hurt.

## 2020-02-10 NOTE — Discharge Instructions (Addendum)
Prescribed triamcinolone cream Take as prescribed and to completion Limit hot shower and baths, or bathe with warm water.   Moisturize skin daily Follow up with PCP if symptoms persists Return or go to the ER if you have any new or worsening symptoms 

## 2020-02-10 NOTE — ED Provider Notes (Signed)
[facility]   [id number] 02/10/20 Arrival Time: 1312   Chief Complaint  Patient presents with   Rash     SUBJECTIVE: History from: patient.  Luis Simpson is a 34 y.o. male presented to the urgent care with a complaint of dry skin rash on glans of penis for the past few months.  Denies any precipitating event.  Localized the rash to his glans of penis.  He described as red and sometimes itchy.  Has tried OTC Neosporin, antifungal medication with no relief.  Denies alleviating or aggravating factor.  Denies similar symptoms in the past.  Denies chills, fever, nausea, vomiting, diarrhea.  ROS: As per HPI.  All other pertinent ROS negative.      Past Medical History:  Diagnosis Date   Constipation    Past Surgical History:  Procedure Laterality Date   NASAL SEPTOPLASTY W/ TURBINOPLASTY Bilateral 06/08/2019   Procedure: NASAL SEPTOPLASTY WITH  BILATERAL TURBINATE REDUCTION;  Surgeon: Newman Pies, MD;  Location: [facility];  Service: ENT;  Laterality: Bilateral;   WISDOM TOOTH EXTRACTION     No Known Allergies No current facility-administered medications on file prior to encounter.   Current Outpatient Medications on File Prior to Encounter  Medication Sig Dispense Refill   acetaminophen (TYLENOL) 325 MG tablet Take 2 tablets (650 mg total) by mouth every 6 (six) hours as needed for mild pain or headache (fever >/= 101). 12 tablet 0   albuterol (VENTOLIN HFA) 108 (90 Base) MCG/ACT inhaler Inhale 2 puffs into the lungs every 4 (four) hours as needed for wheezing or shortness of breath. 18 g 0   amLODipine (NORVASC) 2.5 MG tablet Take 1 tablet (2.5 mg total) by mouth daily. For BP 30 tablet 1   ascorbic acid (VITAMIN C) 1000 MG tablet Take 1 tablet (1,000 mg total) by mouth 2 (two) times daily. 30 tablet 2   calcium-vitamin D (OSCAL WITH D) 500-200 MG-UNIT tablet Take 1 tablet by mouth daily with breakfast. 30 tablet 0   cetirizine (ZYRTEC ALLERGY)  10 MG tablet Take 1 tablet (10 mg total) by mouth daily. 30 tablet 0   cholecalciferol (VITAMIN D3) 25 MCG (1000 UNIT) tablet Take 2,000 Units by mouth daily.     fluticasone (FLONASE) 50 MCG/ACT nasal spray Place 1 spray into both nostrils daily for 14 days. 16 g 0   Multiple Vitamin (MULTIVITAMIN) capsule Take 1 capsule by mouth daily.     OXYGEN Inhale 3 L into the lungs continuous.     sodium chloride (OCEAN) 0.65 % SOLN nasal spray Place 1 spray into both nostrils as needed for congestion. 30 mL 0   zinc sulfate 220 (50 Zn) MG capsule Take 1 capsule (220 mg total) by mouth daily. 30 capsule 1   Social History   Socioeconomic History   Marital status: Married    Spouse name: Not on file   Number of children: 2   Years of education: Not on file   Highest education level: Not on file  Occupational History   Occupation: Corporate investment banker  Tobacco Use   Smoking status: Former Smoker    Packs/day: 1.00    Years: 15.00    Pack years: 15.00    Types: Cigarettes    Quit date: 01/20/2015    Years since quitting: 5.0   Smokeless tobacco: Never Used  Vaping Use   Vaping Use: Never used  Substance and Sexual Activity   Alcohol use: No    Comment: rarely  Drug use: No   Sexual activity: Yes    Partners: Female  Other Topics Concern   Not on file  Social History Narrative   Not on file   Social Determinants of Health   Financial Resource Strain: Not on file  Food Insecurity: Not on file  Transportation Needs: Not on file  Physical Activity: Not on file  Stress: Not on file  Social Connections: Not on file  Intimate Partner Violence: Not on file   Family History  Problem Relation Age of Onset   Diverticulitis Father    Colon cancer Neg Hx    Rectal cancer Neg Hx    Stomach cancer Neg Hx     OBJECTIVE:  Vitals:   02/10/20 1350  BP: 131/89  Pulse: (!) 103  Resp: 16  Temp: 97.8 F (36.6 C)  SpO2: 97%     Physical Exam Vitals and nursing  note reviewed.  Constitutional:      General: He is not in acute distress.    Appearance: Normal appearance. He is normal weight. He is not ill-appearing, toxic-appearing or diaphoretic.  Cardiovascular:     Rate and Rhythm: Normal rate and regular rhythm.     Pulses: Normal pulses.     Heart sounds: Normal heart sounds. No murmur heard. No friction rub. No gallop.   Pulmonary:     Effort: Pulmonary effort is normal. No respiratory distress.     Breath sounds: Normal breath sounds. No stridor. No wheezing, rhonchi or rales.  Chest:     Chest wall: No tenderness.  Genitourinary:    Pubic Area: Rash present.     Penis: Normal.     Skin:    General: Skin is warm.     Findings: Rash present. Rash is macular.  Neurological:     Mental Status: He is alert and oriented to person, place, and time.     LABS:  No results found for this or any previous visit (from the past 24 hour(s)).   ASSESSMENT & PLAN:  1. Rash in adult     Meds ordered this encounter  Medications   triamcinolone (KENALOG) 0.1 %    Sig: Apply 1 application topically 2 (two) times daily.    Dispense:  30 g    Refill:  0    Discharge Instructions  Prescribed  triamcinolone cream Take as prescribed and to completion Limit hot shower and baths, or bathe with warm water.   Moisturize skin daily Follow up with PCP if symptoms persists Return or go to the ER if you have any new or worsening symptoms     Reviewed expectations re: course of current medical issues. Questions answered. Outlined signs and symptoms indicating need for more acute intervention. Patient verbalized understanding. After Visit Summary given.         Durward Parcel, FNP 02/10/20 1427

## 2021-03-28 ENCOUNTER — Ambulatory Visit
Admission: EM | Admit: 2021-03-28 | Discharge: 2021-03-28 | Disposition: A | Discharge status: Home / Self Care | Payer: 59 | Attending: Family Medicine | Admitting: Family Medicine

## 2021-03-28 ENCOUNTER — Other Ambulatory Visit: Payer: Self-pay

## 2021-03-28 DIAGNOSIS — B370 Candidal stomatitis: Secondary | ICD-10-CM | POA: Diagnosis not present

## 2021-03-28 DIAGNOSIS — B356 Tinea cruris: Secondary | ICD-10-CM | POA: Diagnosis not present

## 2021-03-28 LAB — POCT FASTING CBG KUC MANUAL ENTRY: POCT Glucose (KUC): 109 mg/dL — AB (ref 70–99)

## 2021-03-28 MED ORDER — FLUCONAZOLE 200 MG PO TABS: 200.0000 mg | ORAL_TABLET | ORAL | 0 refills | Status: DC

## 2021-03-28 MED ORDER — CLOTRIMAZOLE 10 MG MT TROC: 10.0000 mg | Freq: Every day | OROMUCOSAL | 0 refills | Status: DC

## 2021-03-28 NOTE — ED Provider Notes (Signed)
[facility]   [id number] 03/28/21 Arrival Time: 0807  ASSESSMENT & PLAN:  1. Tinea cruris   2. Oral candidiasis    No signs of bacterial infection.  Non-fasting CBG: Results for orders placed or performed during the hospital encounter of 03/28/21  POCT CBG (manual entry)  Result Value Ref Range   POCT Glucose (KUC) 109 (A) 70 - 99 mg/dL   Begin: Discharge Medication List as of 03/28/2021  8:50 AM     START taking these medications   Details  clotrimazole (MYCELEX) 10 MG troche Take 1 tablet (10 mg total) by mouth 5 (five) times daily., Starting Wed 03/28/2021, Normal    fluconazole (DIFLUCAN) 200 MG tablet Take 1 tablet (200 mg total) by mouth once a week., Starting Wed 03/28/2021, Normal         Follow-up Information     Junie Spencer, FNP.   Specialty: Family Medicine Why: If worsening or failing to improve as anticipated. Contact information: [address] [place] [address] [phone]                 Reviewed expectations re: course of current medical issues. Questions answered. Outlined signs and symptoms indicating need for more acute intervention. Understanding verbalized. After Visit Summary given.   SUBJECTIVE: History from: Patient. Luis Simpson is a 36 y.o. male. Reports: oral pain; bilateral lower gums; h/o thrush "and it feels the same". Denies: fever. Normal PO intake without n/v/d. Also with questionable yeast, jock itch; sev months. Is painful at times. Afebrile.  OBJECTIVE:  Vitals:   03/28/21 0813  BP: 132/89  Pulse: 81  Resp: 20  Temp: 97.7 F (36.5 C)  TempSrc: Oral  SpO2: 96%    General appearance: alert; no distress Eyes: PERRLA; EOMI; conjunctiva normal HENT: [place]; AT; without nasal congestion; bilateral lower gums, rear, with apparent beginning thrush; TTP Neck: supple s LAD Lungs: speaks full sentences without difficulty; unlabored Extremities: no edema Skin: warm and dry; skin changes around  groin consistent with tinea Neurologic: normal gait Psychological: alert and cooperative; normal mood and affect  Labs: Results for orders placed or performed during the hospital encounter of 03/28/21  POCT CBG (manual entry)  Result Value Ref Range   POCT Glucose (KUC) 109 (A) 70 - 99 mg/dL   Labs Reviewed  POCT FASTING CBG KUC MANUAL ENTRY - Abnormal; Notable for the following components:      Result Value   POCT Glucose (KUC) 109 (*)    All other components within normal limits    Imaging: No results found.  No Active Allergies  Past Medical History:  Diagnosis Date   Constipation    Social History   Socioeconomic History   Marital status: Married    Spouse name: Not on file   Number of children: 2   Years of education: Not on file   Highest education level: Not on file  Occupational History   Occupation: Corporate investment banker  Tobacco Use   Smoking status: Former    Packs/day: 1.00    Years: 15.00    Pack years: 15.00    Types: Cigarettes    Quit date: 01/20/2015    Years since quitting: 6.1   Smokeless tobacco: Never  Vaping Use   Vaping Use: Never used  Substance and Sexual Activity   Alcohol use: No    Comment: rarely   Drug use: No   Sexual activity: Not Currently    Partners: Female  Other Topics Concern  Not on file  Social History Narrative   Not on file   Social Determinants of Health   Financial Resource Strain: Not on file  Food Insecurity: Not on file  Transportation Needs: Not on file  Physical Activity: Not on file  Stress: Not on file  Social Connections: Not on file  Intimate Partner Violence: Not on file   Family History  Problem Relation Age of Onset   Diverticulitis Father    Colon cancer Neg Hx    Rectal cancer Neg Hx    Stomach cancer Neg Hx    Past Surgical History:  Procedure Laterality Date   NASAL SEPTOPLASTY W/ TURBINOPLASTY Bilateral 06/08/2019   Procedure: NASAL SEPTOPLASTY WITH  BILATERAL TURBINATE REDUCTION;   Surgeon: Newman Pies, MD;  Location: [facility];  Service: ENT;  Laterality: Bilateral;   WISDOM TOOTH EXTRACTION       Mardella Layman, MD 03/28/21 1341

## 2021-03-28 NOTE — ED Triage Notes (Signed)
Pt reports painful sores inside of the mouth x 1 day; painful rash in private area, on and off x 1 year. States the rash has some white milky discharge "like mash potatoes".  Reports rash in private area improves with steroids cream and antifungal cream.

## 2021-04-30 ENCOUNTER — Telehealth: Payer: 59 | Admitting: Physician Assistant

## 2021-04-30 DIAGNOSIS — B372 Candidiasis of skin and nail: Secondary | ICD-10-CM

## 2021-04-30 NOTE — Progress Notes (Signed)
Based on what you shared with me, I feel your condition warrants further evaluation and I recommend that you be seen for a face to face visit.  Please contact your primary care physician practice to be seen. Many offices offer virtual options to be seen via video if you are not comfortable going in person to a medical facility at this time. ? ?Giving recurrence of symptoms after previous provider gave 4 weekly doses of anti-yeast medication, I would really recommend follow-up with your PCP in person as they may need to do an extended course of medication that we cannot give via e-visit, and/or assess for why this is recurring and make sure nothing else going on.  ? ?NOTE: You will NOT be charged for this eVisit. ? ?If you do not have a PCP, [facility] offers a free physician referral service available at [phone]. Our trained staff has the experience, knowledge and resources to put you in touch with a physician who is right for you.  ? ? ?If you are having a true medical emergency please call 911. ? ? ?Your e-visit answers were reviewed by a board certified advanced clinical practitioner to complete your personal care plan.  Thank you for using e-Visits. ? ?

## 2021-08-05 IMAGING — DX DG CHEST 1V PORT
1 series · 1 of 1 positions shown · non-contrast
Comparison: None.

CLINICAL DATA: Dyspnea, COVID positive

EXAM:
PORTABLE CHEST 1 VIEW

[chest ap]
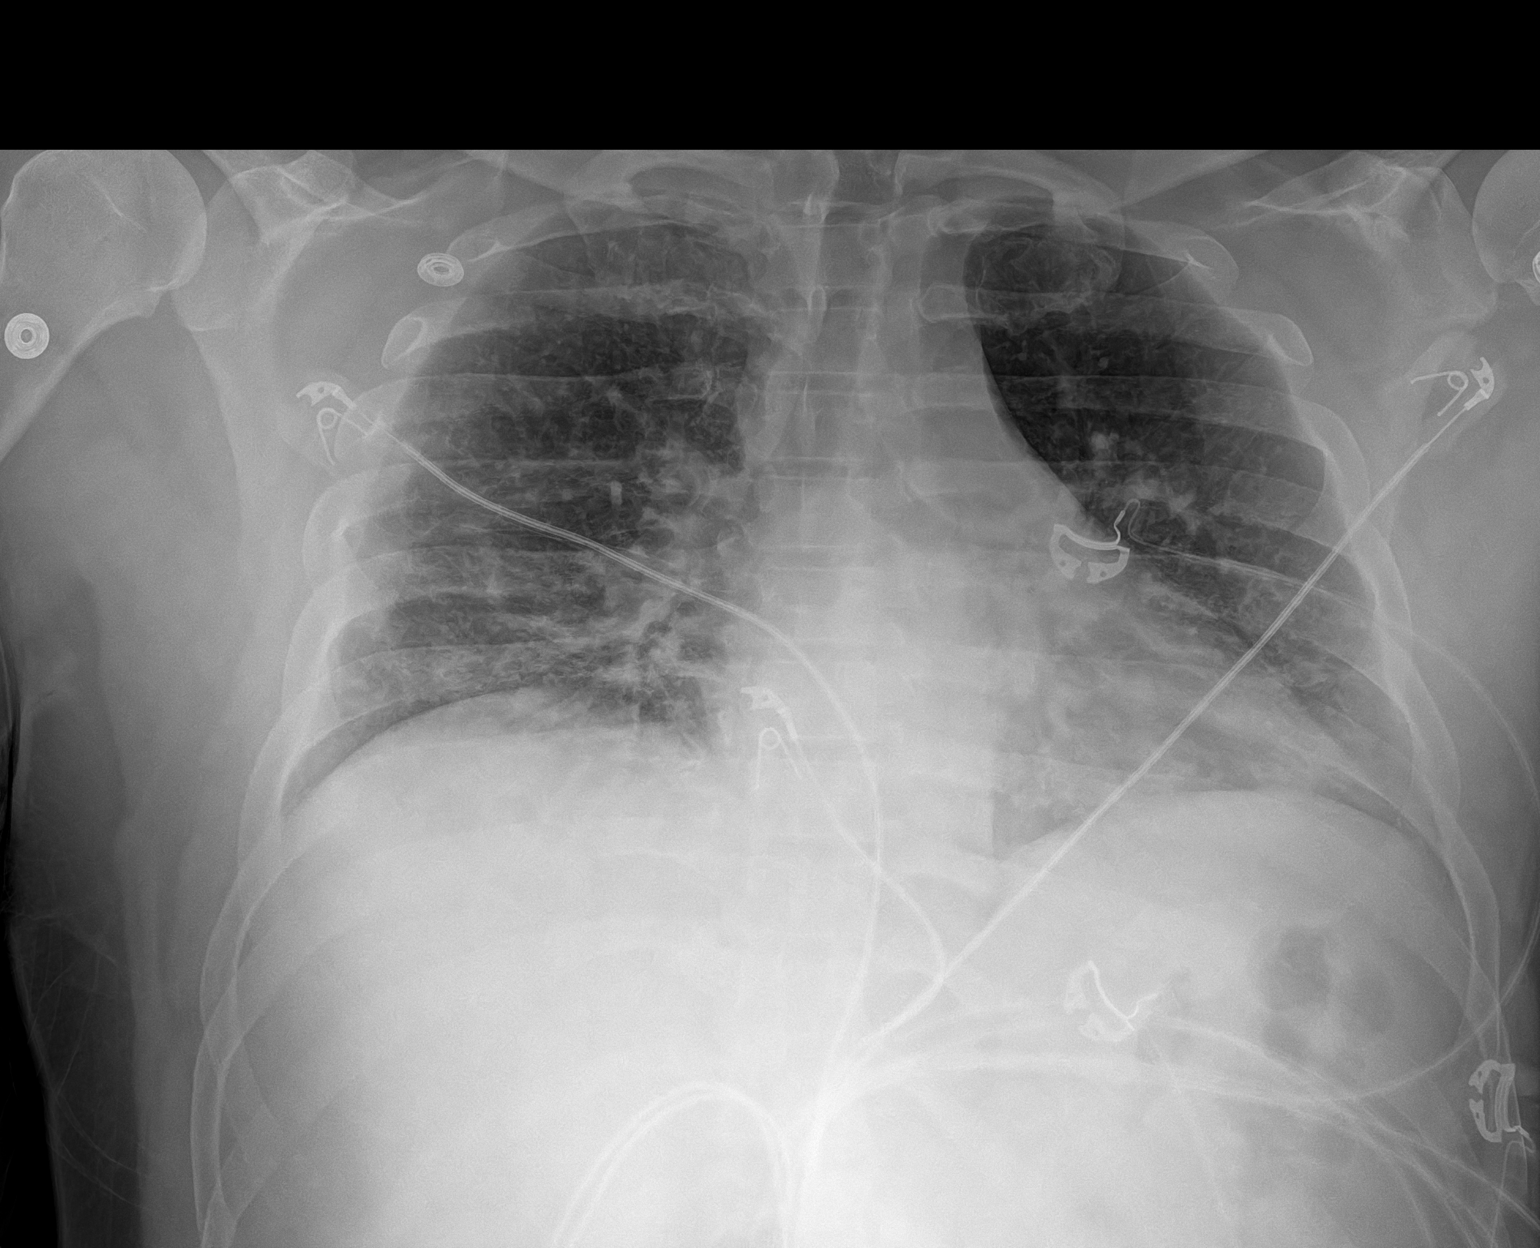

[1 of 1 positions shown; findings below may reference images not displayed]

FINDINGS: Lung volumes are small. Mild left basilar atelectasis or infiltrate.
No pneumothorax or pleural effusion. Cardiac size within normal
limits. No acute bone abnormality.
IMPRESSION: Pulmonary hypoinflation. Superimposed left basilar atelectasis or
infiltrate.

## 2022-06-06 ENCOUNTER — Encounter: Payer: Self-pay | Admitting: Family

## 2022-06-06 ENCOUNTER — Ambulatory Visit (INDEPENDENT_AMBULATORY_CARE_PROVIDER_SITE_OTHER): Payer: BC Managed Care – PPO | Admitting: Family

## 2022-06-06 VITALS — BP 131/87 | HR 88 | Temp 97.9°F | Ht 67.0 in | Wt 249.0 lb

## 2022-06-06 DIAGNOSIS — E669 Obesity, unspecified: Secondary | ICD-10-CM

## 2022-06-06 DIAGNOSIS — M5441 Lumbago with sciatica, right side: Secondary | ICD-10-CM

## 2022-06-06 DIAGNOSIS — M25562 Pain in left knee: Secondary | ICD-10-CM | POA: Diagnosis not present

## 2022-06-06 DIAGNOSIS — Z0001 Encounter for general adult medical examination with abnormal findings: Secondary | ICD-10-CM | POA: Diagnosis not present

## 2022-06-06 DIAGNOSIS — Z1159 Encounter for screening for other viral diseases: Secondary | ICD-10-CM

## 2022-06-06 DIAGNOSIS — M5442 Lumbago with sciatica, left side: Secondary | ICD-10-CM | POA: Diagnosis not present

## 2022-06-06 DIAGNOSIS — Z0000 Encounter for general adult medical examination without abnormal findings: Secondary | ICD-10-CM

## 2022-06-06 DIAGNOSIS — G8929 Other chronic pain: Secondary | ICD-10-CM | POA: Insufficient documentation

## 2022-06-06 LAB — LIPID PANEL

## 2022-06-06 MED ORDER — DICLOFENAC SODIUM 75 MG PO TBEC
75.0000 mg | DELAYED_RELEASE_TABLET | Freq: Two times a day (BID) | ORAL | 2 refills | Status: DC
Start: 2022-06-06 — End: 2023-06-10

## 2022-06-06 NOTE — Patient Instructions (Signed)

## 2022-06-06 NOTE — Progress Notes (Signed)
Subjective:    Patient ID: Luis Simpson, male    DOB: December 26, 1985, 37 y.o.   MRN: [id number]  Chief Complaint  Patient presents with   Annual Exam   Pt presents to the office today for CPE. He is followed by Chiropractor weekly for lower back pain.   He is complaining of fatigue.   He has chronic left knee pain that comes and goes. Has intermittent aching pain and weakness with walking down steps. Requesting MRI results to take to chiropractor.  Back Pain This is a chronic problem. The current episode started more than 1 year ago. The problem occurs intermittently. The problem has been waxing and waning since onset. The pain is present in the lumbar spine. The quality of the pain is described as aching. The pain is at a severity of 3/10. The pain is mild. The symptoms are aggravated by twisting and bending.      Review of Systems  Musculoskeletal:  Positive for back pain.  All other systems reviewed and are negative.  Family History  Problem Relation Age of Onset   Diverticulitis Father    Colon cancer Neg Hx    Rectal cancer Neg Hx    Stomach cancer Neg Hx    Social History   Socioeconomic History   Marital status: Married    Spouse name: Not on file   Number of children: 2   Years of education: Not on file   Highest education level: Not on file  Occupational History   Occupation: Corporate investment banker  Tobacco Use   Smoking status: Former    Packs/day: 1.00    Years: 15.00    Additional pack years: 0.00    Total pack years: 15.00    Types: Cigarettes    Quit date: 01/20/2015    Years since quitting: 7.3   Smokeless tobacco: Never  Vaping Use   Vaping Use: Never used  Substance and Sexual Activity   Alcohol use: No    Comment: rarely   Drug use: No   Sexual activity: Not Currently    Partners: Female  Other Topics Concern   Not on file  Social History Narrative   Not on file   Social Determinants of Health   Financial Resource Strain: Not on file  Food  Insecurity: Not on file  Transportation Needs: Not on file  Physical Activity: Not on file  Stress: Not on file  Social Connections: Not on file       Objective:   Physical Exam Vitals reviewed.  Constitutional:      General: He is not in acute distress.    Appearance: He is well-developed. He is obese.  HENT:     Head: Normocephalic.     Right Ear: Tympanic membrane normal.     Left Ear: Tympanic membrane normal.  Eyes:     General:        Right eye: No discharge.        Left eye: No discharge.     Pupils: Pupils are equal, round, and reactive to light.  Neck:     Thyroid: No thyromegaly.  Cardiovascular:     Rate and Rhythm: Normal rate and regular rhythm.     Heart sounds: Normal heart sounds. No murmur heard. Pulmonary:     Effort: Pulmonary effort is normal. No respiratory distress.     Breath sounds: Normal breath sounds. No wheezing.  Abdominal:     General: Bowel sounds are normal. There is no  distension.     Palpations: Abdomen is soft.     Tenderness: There is no abdominal tenderness.  Musculoskeletal:        General: No tenderness. Normal range of motion.     Cervical back: Normal range of motion and neck supple.  Skin:    General: Skin is warm and dry.     Findings: No erythema or rash.  Neurological:     Mental Status: He is alert and oriented to person, place, and time.     Cranial Nerves: No cranial nerve deficit.     Deep Tendon Reflexes: Reflexes are normal and symmetric.  Psychiatric:        Behavior: Behavior normal.        Thought Content: Thought content normal.        Judgment: Judgment normal.       BP 131/87   Pulse 88   Temp 97.9 F (36.6 C) (Temporal)   Ht  (1.702 m)   Wt 249 lb (112.9 kg)   SpO2 99%   BMI 39.00 kg/m      Assessment & Plan:  Luis Simpson comes in today with chief complaint of Annual Exam   Diagnosis and orders addressed:  1. Annual physical exam - CMP14+EGFR - CBC with Differential/Platelet -  Lipid panel - TSH - Hepatitis C antibody  2. Obesity (BMI 30-39.9) - CMP14+EGFR - CBC with Differential/Platelet  3. Chronic bilateral low back pain with bilateral sciatica Start diclofenac BID  No other NSAIDs - CMP14+EGFR - CBC with Differential/Platelet - diclofenac (VOLTAREN) 75 MG EC tablet; Take 1 tablet (75 mg total) by mouth 2 (two) times daily.  Dispense: 60 tablet; Refill: 2 - Ambulatory referral to Physical Therapy  4. Chronic pain of left knee - CMP14+EGFR - CBC with Differential/Platelet - diclofenac (VOLTAREN) 75 MG EC tablet; Take 1 tablet (75 mg total) by mouth 2 (two) times daily.  Dispense: 60 tablet; Refill: 2 - Ambulatory referral to Physical Therapy  5. Need for hepatitis C screening test - CMP14+EGFR - CBC with Differential/Platelet - Hepatitis C antibody   Labs pending Health Maintenance reviewed Diet and exercise encouraged  Follow up plan: 1 year    Jannifer Rodney, FNP

## 2022-06-07 LAB — CMP14+EGFR
ALT: 47 IU/L — ABNORMAL HIGH (ref 0–44)
AST: 32 IU/L (ref 0–40)
Albumin/Globulin Ratio: 1.5 (ref 1.2–2.2)
Albumin: 4.5 g/dL (ref 4.1–5.1)
Alkaline Phosphatase: 107 IU/L (ref 44–121)
BUN/Creatinine Ratio: 11 (ref 9–20)
BUN: 10 mg/dL (ref 6–20)
Bilirubin Total: 0.4 mg/dL (ref 0.0–1.2)
CO2: 23 mmol/L (ref 20–29)
Calcium: 10.3 mg/dL — ABNORMAL HIGH (ref 8.7–10.2)
Chloride: 99 mmol/L (ref 96–106)
Creatinine, Ser: 0.93 mg/dL (ref 0.76–1.27)
Globulin, Total: 3 g/dL (ref 1.5–4.5)
Glucose: 81 mg/dL (ref 70–99)
Potassium: 4.7 mmol/L (ref 3.5–5.2)
Sodium: 140 mmol/L (ref 134–144)
Total Protein: 7.5 g/dL (ref 6.0–8.5)
eGFR: 108 mL/min/{1.73_m2} (ref >59)

## 2022-06-07 LAB — CBC WITH DIFFERENTIAL/PLATELET
Basophils Absolute: 0.1 10*3/uL (ref 0.0–0.2)
Basos: 1 %
EOS (ABSOLUTE): 0.2 10*3/uL (ref 0.0–0.4)
Eos: 2 %
Hematocrit: 46.1 % (ref 37.5–51.0)
Hemoglobin: 15.6 g/dL (ref 13.0–17.7)
Immature Grans (Abs): 0 10*3/uL (ref 0.0–0.1)
Immature Granulocytes: 0 %
Lymphocytes Absolute: 2.4 10*3/uL (ref 0.7–3.1)
Lymphs: 26 %
MCH: 28.3 pg (ref 26.6–33.0)
MCHC: 33.8 g/dL (ref 31.5–35.7)
MCV: 84 fL (ref 79–97)
Monocytes Absolute: 0.7 10*3/uL (ref 0.1–0.9)
Monocytes: 7 %
Neutrophils Absolute: 5.9 10*3/uL (ref 1.4–7.0)
Neutrophils: 64 %
Platelets: 310 10*3/uL (ref 150–450)
RBC: 5.51 x10E6/uL (ref 4.14–5.80)
RDW: 13 % (ref 11.6–15.4)
WBC: 9.2 10*3/uL (ref 3.4–10.8)

## 2022-06-07 LAB — TSH: TSH: 3.16 u[IU]/mL (ref 0.450–4.500)

## 2022-06-07 LAB — LIPID PANEL: Chol/HDL Ratio: 6.5 ratio — ABNORMAL HIGH (ref 0.0–5.0)

## 2022-06-07 LAB — HEPATITIS C ANTIBODY: Hep C Virus Ab: NONREACTIVE

## 2022-07-03 ENCOUNTER — Encounter: Payer: Self-pay | Admitting: Emergency Medicine

## 2022-07-03 ENCOUNTER — Ambulatory Visit
Admission: EM | Admit: 2022-07-03 | Discharge: 2022-07-03 | Disposition: A | Discharge status: Home / Self Care | Payer: BC Managed Care – PPO | Attending: Nurse Practitioner | Admitting: Nurse Practitioner

## 2022-07-03 ENCOUNTER — Other Ambulatory Visit: Payer: Self-pay

## 2022-07-03 DIAGNOSIS — J22 Unspecified acute lower respiratory infection: Secondary | ICD-10-CM

## 2022-07-03 MED ORDER — PSEUDOEPH-BROMPHEN-DM 30-2-10 MG/5ML PO SYRP: 5.0000 mL | ORAL_SOLUTION | Freq: Four times a day (QID) | ORAL | 0 refills | Status: DC | PRN

## 2022-07-03 MED ORDER — AMOXICILLIN-POT CLAVULANATE 875-125 MG PO TABS: 1.0000 | ORAL_TABLET | Freq: Two times a day (BID) | ORAL | 0 refills | Status: DC

## 2022-07-03 MED ORDER — FLUTICASONE PROPIONATE 50 MCG/ACT NA SUSP: 2.0000 | Freq: Every day | NASAL | 0 refills | Status: DC

## 2022-07-03 NOTE — ED Triage Notes (Signed)
Pt reports nasal congestion, cough, bilateral ear pain x10 days. Pt reports has taken otc medication, nasal lavage with no change in symptoms.

## 2022-07-03 NOTE — Discharge Instructions (Signed)
Take medication as directed. Increase fluids and get plenty of rest. May take over-the-counter ibuprofen or Tylenol as needed for pain, fever, or general discomfort. Recommend normal saline nasal spray to help with nasal congestion throughout the day. For your cough, it may be helpful to use a humidifier at bedtime during sleep. If symptoms do not improve with this treatment, please follow-up with your physician or in this clinic for further evaluation.

## 2022-07-03 NOTE — ED Provider Notes (Signed)
[facility]    CSN: [id number] Arrival date & time: 07/03/22  0809      History   Chief Complaint Chief Complaint  Patient presents with   Nasal Congestion    HPI Luis Simpson is a 37 y.o. male.   The history is provided by the patient.   The patient presents for complaints of nasal congestion, headache, sore throat, tinnitus, postnasal drainage, and cough.  Symptoms have been present for the past 10 days.  Patient reports that he also had a fever.  He states the last fever was several days ago, he complains of feeling hot and cold.  Patient denies ear drainage, use nasal saline rinses and over-the-counter cough and cold medications with little relief.  Past Medical History:  Diagnosis Date   Constipation     Patient Active Problem List   Diagnosis Date Noted   Chronic bilateral low back pain with bilateral sciatica 06/06/2022   Hyponatremia 09/03/2019   Hypokalemia 09/03/2019   Hypocalcemia 09/03/2019   Transaminitis 09/03/2019   Left knee sprain 02/26/2019   Hypertriglyceridemia 08/02/2014   Obesity (BMI 30-39.9) 04/15/2014   Paresthesia of both hands 04/15/2014    Past Surgical History:  Procedure Laterality Date   NASAL SEPTOPLASTY W/ TURBINOPLASTY Bilateral 06/08/2019   Procedure: NASAL SEPTOPLASTY WITH  BILATERAL TURBINATE REDUCTION;  Surgeon: Newman Pies, MD;  Location: [facility];  Service: ENT;  Laterality: Bilateral;   WISDOM TOOTH EXTRACTION         Home Medications    Prior to Admission medications   Medication Sig Start Date End Date Taking? Authorizing Provider  amoxicillin-clavulanate (AUGMENTIN) 875-125 MG tablet Take 1 tablet by mouth every 12 (twelve) hours. 07/03/22  Yes Ronniesha Seibold-Warren, Sadie Haber, NP  brompheniramine-pseudoephedrine-DM 30-2-10 MG/5ML syrup Take 5 mLs by mouth 4 (four) times daily as needed. 07/03/22  Yes Jakeb Lamping-Warren, Sadie Haber, NP  fluticasone (FLONASE) 50 MCG/ACT nasal spray Place 2 sprays into  both nostrils daily. 07/03/22  Yes Layah Skousen-Warren, Sadie Haber, NP  diclofenac (VOLTAREN) 75 MG EC tablet Take 1 tablet (75 mg total) by mouth 2 (two) times daily. 06/06/22   Junie Spencer, FNP  Omega-3 Fatty Acids (FISH OIL) 1000 MG CAPS Take by mouth.    [provider]    Family History Family History  Problem Relation Age of Onset   Diverticulitis Father    Colon cancer Neg Hx    Rectal cancer Neg Hx    Stomach cancer Neg Hx     Social History Social History   Tobacco Use   Smoking status: Former    Packs/day: 1.00    Years: 15.00    Additional pack years: 0.00    Total pack years: 15.00    Types: Cigarettes    Quit date: 01/20/2015    Years since quitting: 7.4   Smokeless tobacco: Never  Vaping Use   Vaping Use: Never used  Substance Use Topics   Alcohol use: No    Comment: rarely   Drug use: No     Allergies   Patient has no active allergies.   Review of Systems Review of Systems Per HPI  Physical Exam Triage Vital Signs ED Triage Vitals  Enc Vitals Group     BP 07/03/22 0824 (!) 141/84     Pulse Rate 07/03/22 0824 (!) 124     Resp 07/03/22 0824 20     Temp 07/03/22 0824 98.2 F (36.8 C)     Temp Source 07/03/22  1610 Oral     SpO2 07/03/22 0824 92 %     Weight --      Height --      Head Circumference --      Peak Flow --      Pain Score 07/03/22 0823 3     Pain Loc --      Pain Edu? --      Excl. in GC? --    No data found.  Updated Vital Signs BP (!) 141/84 (BP Location: Right Arm)   Pulse (!) 124   Temp 98.2 F (36.8 C) (Oral)   Resp 20   SpO2 92%   Visual Acuity Right Eye Distance:   Left Eye Distance:   Bilateral Distance:    Right Eye Near:   Left Eye Near:    Bilateral Near:     Physical Exam Vitals and nursing note reviewed.  Constitutional:      General: He is not in acute distress.    Appearance: Normal appearance.  HENT:     Head: Normocephalic.     Right Ear: Tympanic membrane, ear canal and external ear  normal.     Left Ear: Tympanic membrane, ear canal and external ear normal.     Nose: Congestion present. No rhinorrhea.     Right Turbinates: Enlarged and swollen.     Left Turbinates: Enlarged and swollen.     Right Sinus: No maxillary sinus tenderness or frontal sinus tenderness.     Left Sinus: No maxillary sinus tenderness or frontal sinus tenderness.     Mouth/Throat:     Mouth: Mucous membranes are moist.     Pharynx: Posterior oropharyngeal erythema present.  Eyes:     Extraocular Movements: Extraocular movements intact.     Conjunctiva/sclera: Conjunctivae normal.     Pupils: Pupils are equal, round, and reactive to light.  Cardiovascular:     Rate and Rhythm: Regular rhythm. Tachycardia present.     Pulses: Normal pulses.     Heart sounds: Normal heart sounds.  Pulmonary:     Effort: Pulmonary effort is normal. No respiratory distress.     Breath sounds: Normal breath sounds. No stridor. No wheezing, rhonchi or rales.  Abdominal:     General: Bowel sounds are normal.     Palpations: Abdomen is soft.     Tenderness: There is no abdominal tenderness.  Musculoskeletal:     Cervical back: Normal range of motion.  Lymphadenopathy:     Cervical: No cervical adenopathy.  Skin:    General: Skin is warm and dry.  Neurological:     General: No focal deficit present.     Mental Status: He is alert and oriented to person, place, and time.  Psychiatric:        Mood and Affect: Mood normal.        Behavior: Behavior normal.      UC Treatments / Results  Labs (all labs ordered are listed, but only abnormal results are displayed) Labs Reviewed - No data to display  EKG   Radiology No results found.  Procedures Procedures (including critical care time)  Medications Ordered in UC Medications - No data to display  Initial Impression / Assessment and Plan / UC Course  I have reviewed the triage vital signs and the nursing notes.  Pertinent labs & imaging results  that were available during my care of the patient were reviewed by me and considered in my medical decision making (see chart for  details).  The patient is well-appearing, he is in no acute distress, vital signs are stable.  Will treat for lower respiratory infection.  Viral testing is not indicated based on the duration of the patient's symptoms.  Symptoms have been present for 10 days.  Will treat with Augmentin 875/125 mg tab for infection, Bromfed-DM for his cough, and nasal congestion and runny nose.  Supportive care recommendations were provided and discussed with the patient to include use over-the-counter analgesics for pain or discomfort, normal saline nasal spray, and use of a humidifier in his bedroom at nighttime during sleep.  Patient was advised if symptoms do not improve with this treatment, recommend following up in this clinic or with his PCP for further evaluation.  Patient also advised to monitor his blood pressure and heart rate while taking the medication prescribed today.  Patient advised to stop medication if heart rate and blood pressure become grossly elevated.  Patient is in agreement with this plan of care and verbalizes understanding.  All questions were answered.  Patient stable for discharge.  Final Clinical Impressions(s) / UC Diagnoses   Final diagnoses:  Lower respiratory infection     Discharge Instructions      Take medication as directed. Increase fluids and get plenty of rest. May take over-the-counter ibuprofen or Tylenol as needed for pain, fever, or general discomfort. Recommend normal saline nasal spray to help with nasal congestion throughout the day. For your cough, it may be helpful to use a humidifier at bedtime during sleep. If symptoms do not improve with this treatment, please follow-up with your physician or in this clinic for further evaluation.       ED Prescriptions     Medication Sig Dispense Auth. Provider   amoxicillin-clavulanate  (AUGMENTIN) 875-125 MG tablet Take 1 tablet by mouth every 12 (twelve) hours. 14 tablet Gertude Benito-Warren, Sadie Haber, NP   brompheniramine-pseudoephedrine-DM 30-2-10 MG/5ML syrup Take 5 mLs by mouth 4 (four) times daily as needed. 140 mL Rikki Smestad-Warren, Sadie Haber, NP   fluticasone (FLONASE) 50 MCG/ACT nasal spray Place 2 sprays into both nostrils daily. 16 g Zoua Caporaso-Warren, Sadie Haber, NP      PDMP not reviewed this encounter.   Abran Cantor, NP 07/03/22 1154

## 2023-04-14 ENCOUNTER — Encounter: Payer: Self-pay | Admitting: *Deleted

## 2023-06-06 ENCOUNTER — Encounter: Payer: BC Managed Care – PPO | Admitting: Family

## 2023-06-10 ENCOUNTER — Encounter: Payer: Self-pay | Admitting: Family

## 2023-06-10 ENCOUNTER — Ambulatory Visit (INDEPENDENT_AMBULATORY_CARE_PROVIDER_SITE_OTHER): Payer: BC Managed Care – PPO | Admitting: Family

## 2023-06-10 VITALS — BP 121/82 | HR 92 | Temp 97.8°F | Ht 67.0 in | Wt 249.4 lb

## 2023-06-10 DIAGNOSIS — R5383 Other fatigue: Secondary | ICD-10-CM | POA: Diagnosis not present

## 2023-06-10 DIAGNOSIS — Z0000 Encounter for general adult medical examination without abnormal findings: Secondary | ICD-10-CM

## 2023-06-10 DIAGNOSIS — G8929 Other chronic pain: Secondary | ICD-10-CM

## 2023-06-10 DIAGNOSIS — M5442 Lumbago with sciatica, left side: Secondary | ICD-10-CM | POA: Diagnosis not present

## 2023-06-10 DIAGNOSIS — E669 Obesity, unspecified: Secondary | ICD-10-CM | POA: Diagnosis not present

## 2023-06-10 DIAGNOSIS — Z0001 Encounter for general adult medical examination with abnormal findings: Secondary | ICD-10-CM | POA: Diagnosis not present

## 2023-06-10 DIAGNOSIS — M5441 Lumbago with sciatica, right side: Secondary | ICD-10-CM

## 2023-06-10 LAB — LIPID PANEL

## 2023-06-10 NOTE — Patient Instructions (Addendum)
 Fatigue If you have fatigue, you feel tired all the time and have a lack of energy or a lack of motivation. Fatigue may make it difficult to start or complete tasks because of exhaustion. Occasional or mild fatigue is often a normal response to activity or life. However, long-term (chronic) or extreme fatigue may be a symptom of a medical condition such as: Depression. Not having enough red blood cells or hemoglobin in the blood (anemia). A problem with a small gland located in the lower front part of the neck (thyroid disorder). Rheumatologic conditions. These are problems related to the body's defense system (immune system). Infections, especially certain viral infections. Fatigue can also lead to negative health outcomes over time. Follow these instructions at home: Medicines Take over-the-counter and prescription medicines only as told by your health care provider. Take a multivitamin if told by your health care provider. Do not use herbal or dietary supplements unless they are approved by your health care provider. Eating and drinking  Avoid heavy meals in the evening. Eat a well-balanced diet, which includes lean proteins, whole grains, plenty of fruits and vegetables, and low-fat dairy products. Avoid eating or drinking too many products with caffeine in them. Avoid alcohol. Drink enough fluid to keep your urine pale yellow. Activity  Exercise regularly, as told by your health care provider. Use or practice techniques to help you relax, such as yoga, tai chi, meditation, or massage therapy. Lifestyle Change situations that cause you stress. Try to keep your work and personal schedules in balance. Do not use recreational or illegal drugs. General instructions Monitor your fatigue for any changes. Go to bed and get up at the same time every day. Avoid fatigue by pacing yourself during the day and getting enough sleep at night. Maintain a healthy weight. Contact a health care  provider if: Your fatigue does not get better. You have a fever. You suddenly lose or gain weight. You have headaches. You have trouble falling asleep or sleeping through the night. You feel angry, guilty, anxious, or sad. You have swelling in your legs or another part of your body. Get help right away if: You feel confused, feel like you might faint, or faint. Your vision is blurry or you have a severe headache. You have severe pain in your abdomen, your back, or the area between your waist and hips (pelvis). You have chest pain, shortness of breath, or an irregular or fast heartbeat. You are unable to urinate, or you urinate less than normal. You have abnormal bleeding from the rectum, nose, lungs, nipples, or, if you are male, the vagina. You vomit blood. You have thoughts about hurting yourself or others. These symptoms may be an emergency. Get help right away. Call 911. Do not wait to see if the symptoms will go away. Do not drive yourself to the hospital. Get help right away if you feel like you may hurt yourself or others, or have thoughts about taking your own life. Go to your nearest emergency room or: Call 911. Call the National Suicide Prevention Lifeline at (986) 028-3873 or 988. This is open 24 hours a day. Text the Crisis Text Line at 613 407 2128. Summary If you have fatigue, you feel tired all the time and have a lack of energy or a lack of motivation. Fatigue may make it difficult to start or complete tasks because of exhaustion. Long-term (chronic) or extreme fatigue may be a symptom of a medical condition. Exercise regularly, as told by your health care provider.  Change situations that cause you stress. Try to keep your work and personal schedules in balance. This information is not intended to replace advice given to you by your health care provider. Make sure you discuss any questions you have with your health care provider.  Living With Sleep Apnea Sleep apnea is a  condition that affects your breathing while you're sleeping. Your tongue or the tissue in your throat may block the flow of air while you sleep. You may have shallow breathing or stop breathing for short periods of time. The breaks in breathing interrupt the deep sleep that you need to feel rested. Even if you don't wake up from the gaps in breathing, you may feel tired during the day. People with sleep apnea may snore loudly. You may have a headache in the morning and feel anxious or depressed. How can sleep apnea affect me? Sleep apnea increases your chances of being very tired during the day. This is called daytime fatigue. Sleep apnea can also increase your risk of: Heart attack. Stroke. Obesity. Type 2 diabetes. Heart failure. Irregular heartbeat. High blood pressure. If you are very tired during the day, you may be more likely to: Not do well in school or at work. Fall asleep while driving. Have trouble paying attention. Develop depression or anxiety. Have problems having sex. This is called sexual dysfunction. What actions can I take to manage sleep apnea? Sleep apnea treatment  If you were given a device to open your airway while you sleep, use it only as told by your health care provider. You may be given: An oral appliance. This is a mouthpiece that shifts your lower jaw forward. A continuous positive airway pressure (CPAP) device. This blows air through a mask. A nasal expiratory positive airway pressure (EPAP) device. This has valves that you put into each nostril. A bi-level positive airway pressure (BIPAP) device. This blows air through a mask when you breathe in and breathe out. You may need surgery if other treatments don't work for you. Sleep habits Go to sleep and wake up at the same time every day. This helps set your internal clock for sleeping. If you stay up later than usual on weekends, try to get up in the morning within 2 hours of the time you usually wake  up. Try to get at least 7-9 hours of sleep each night. Stop using a computer, tablet, and mobile phone a few hours before bedtime. Do not take long naps during the day. If you nap, limit it to 30 minutes. Have a relaxing bedtime routine. Reading or listening to music may relax you and help you sleep. Use your bedroom only for sleep. Keep your television and computer out of your bedroom. Keep your bedroom cool, dark, and quiet. Use a supportive mattress and pillows. Follow your provider's instructions for other changes to sleep habits. Nutrition Do not eat big meals in the evening. Do not have caffeine in the later part of the day. The effects of caffeine can last for more than 5 hours. Follow your provider's instructions for any changes to what you eat and drink. Lifestyle Do not drink alcohol before bedtime. Alcohol can cause you to fall asleep at first, but then it can cause you to wake up in the middle of the night and have trouble getting back to sleep. Do not smoke, vape, or use nicotine or tobacco. Medicines Take over-the-counter and prescription medicines only as told by your provider. Do not use over-the-counter sleep medicine.  You may become dependent on this medicine, and it can make sleep apnea worse. Do not take medicines, such as sedatives and narcotics, unless told to by your provider. Activity Exercise on most days, but avoid exercising in the evening. Exercising near bedtime can interfere with sleeping. If possible, spend time outside every day. Natural light helps with your internal clock. General information Lose weight if you need to. Stay at a healthy weight. If you are having surgery, make sure to tell your provider that you have sleep apnea. You may need to bring your device with you. Keep all follow-up visits. Your provider will want to check on your condition. Where to find more information National Heart, Lung, and Blood Institute: [employer] This  information is not intended to replace advice given to you by your health care provider. Make sure you discuss any questions you have with your health care provider. Document Revised: 05/29/2022 Document Reviewed: 05/29/2022 Elsevier Patient Education  2024 Elsevier Inc. Document Revised: 11/27/2020 Document Reviewed: 11/27/2020 Elsevier Patient Education  2024 [employer].

## 2023-06-10 NOTE — Progress Notes (Signed)
 Subjective:    Patient ID: Luis Simpson, male    DOB: 24-Jul-1985, 38 y.o.   MRN: [id number]  Chief Complaint  Patient presents with   Medical Management of Chronic Issues   Pt presents to the office today for CPE. He is followed by Chiropractor weekly for lower back pain.   He has OSA and using CPAP.   Complaining of ongoing fatigue. Unsure if this will improve since he starting wearing his CPAP.  Back Pain This is a chronic problem. The current episode started more than 1 year ago. The problem occurs intermittently. The problem has been waxing and waning since onset. The pain is present in the lumbar spine. The quality of the pain is described as aching. The pain is at a severity of 1/10. The pain is mild. The symptoms are aggravated by twisting and bending. He has tried chiropractic manipulation for the symptoms. The treatment provided moderate relief.      Review of Systems  Musculoskeletal:  Positive for back pain.  All other systems reviewed and are negative.  Family History  Problem Relation Age of Onset   Diverticulitis Father    Colon cancer Neg Hx    Rectal cancer Neg Hx    Stomach cancer Neg Hx    Social History   Socioeconomic History   Marital status: Divorced    Spouse name: Not on file   Number of children: 2   Years of education: Not on file   Highest education level: Not on file  Occupational History   Occupation: Corporate investment banker  Tobacco Use   Smoking status: Former    Current packs/day: 0.00    Average packs/day: 1 pack/day for 15.0 years (15.0 ttl pk-yrs)    Types: Cigarettes    Start date: 01/20/2000    Quit date: 01/20/2015    Years since quitting: 8.3   Smokeless tobacco: Never  Vaping Use   Vaping status: Never Used  Substance and Sexual Activity   Alcohol use: No    Comment: rarely   Drug use: No   Sexual activity: Not Currently    Partners: Female  Other Topics Concern   Not on file  Social History Narrative   Not on file    Social Drivers of Health   Financial Resource Strain: Not on file  Food Insecurity: Not on file  Transportation Needs: Not on file  Physical Activity: Not on file  Stress: Not on file  Social Connections: Not on file       Objective:   Physical Exam Vitals reviewed.  Constitutional:      General: He is not in acute distress.    Appearance: He is well-developed. He is obese.  HENT:     Head: Normocephalic.     Right Ear: Tympanic membrane normal.     Left Ear: Tympanic membrane normal.  Eyes:     General:        Right eye: No discharge.        Left eye: No discharge.     Pupils: Pupils are equal, round, and reactive to light.  Neck:     Thyroid: No thyromegaly.  Cardiovascular:     Rate and Rhythm: Normal rate and regular rhythm.     Heart sounds: Normal heart sounds. No murmur heard. Pulmonary:     Effort: Pulmonary effort is normal. No respiratory distress.     Breath sounds: Normal breath sounds. No wheezing.  Abdominal:     General: Bowel  sounds are normal. There is no distension.     Palpations: Abdomen is soft.     Tenderness: There is no abdominal tenderness.  Musculoskeletal:        General: No tenderness. Normal range of motion.     Cervical back: Normal range of motion and neck supple.  Skin:    General: Skin is warm and dry.     Findings: No erythema or rash.  Neurological:     Mental Status: He is alert and oriented to person, place, and time.     Cranial Nerves: No cranial nerve deficit.     Deep Tendon Reflexes: Reflexes are normal and symmetric.  Psychiatric:        Behavior: Behavior normal.        Thought Content: Thought content normal.        Judgment: Judgment normal.       BP 121/82   Pulse 92   Temp 97.8 F (36.6 C) (Temporal)   Ht 5\' 7"  (1.702 m)   Wt 249 lb 6.4 oz (113.1 kg)   SpO2 97%   BMI 39.06 kg/m      Assessment & Plan:  Luis Simpson comes in today with chief complaint of Medical Management of Chronic  Issues   Diagnosis and orders addressed:  1. Chronic bilateral low back pain with bilateral sciatica - CMP14+EGFR  2. Annual physical exam (Primary) - CMP14+EGFR - Anemia Profile B - Lipid panel - TSH - VITAMIN D  25 Hydroxy (Vit-D Deficiency, Fractures) - Testosterone ,Free and Total  3. Obesity (BMI 30-39.9) - CMP14+EGFR  4. Other fatigue - CMP14+EGFR - Anemia Profile B - TSH - VITAMIN D  25 Hydroxy (Vit-D Deficiency, Fractures) - Testosterone ,Free and Total   Labs pending Continue current medications  Continue CPAP Health Maintenance reviewed Diet and exercise encouraged  Follow up plan: 1 year    Tommas Fragmin, FNP

## 2023-06-11 LAB — CMP14+EGFR
ALT: 36 IU/L (ref 0–44)
AST: 26 IU/L (ref 0–40)
Albumin: 4.2 g/dL (ref 4.1–5.1)
Alkaline Phosphatase: 106 IU/L (ref 44–121)
BUN/Creatinine Ratio: 12 (ref 9–20)
BUN: 11 mg/dL (ref 6–20)
Bilirubin Total: 0.3 mg/dL (ref 0.0–1.2)
CO2: 23 mmol/L (ref 20–29)
Calcium: 9.9 mg/dL (ref 8.7–10.2)
Chloride: 101 mmol/L (ref 96–106)
Creatinine, Ser: 0.89 mg/dL (ref 0.76–1.27)
Globulin, Total: 3.5 g/dL (ref 1.5–4.5)
Glucose: 92 mg/dL (ref 70–99)
Potassium: 5 mmol/L (ref 3.5–5.2)
Sodium: 140 mmol/L (ref 134–144)
Total Protein: 7.7 g/dL (ref 6.0–8.5)
eGFR: 112 mL/min/{1.73_m2} (ref >59)

## 2023-06-11 LAB — ANEMIA PROFILE B
Basophils Absolute: 0.1 10*3/uL (ref 0.0–0.2)
Basos: 1 %
EOS (ABSOLUTE): 0.3 10*3/uL (ref 0.0–0.4)
Eos: 3 %
Ferritin: 139 ng/mL (ref 30–400)
Folate: 11.7 ng/mL (ref >3.0)
Hematocrit: 46.3 % (ref 37.5–51.0)
Hemoglobin: 14.7 g/dL (ref 13.0–17.7)
Immature Grans (Abs): 0 10*3/uL (ref 0.0–0.1)
Immature Granulocytes: 0 %
Iron Saturation: 15 % (ref 15–55)
Iron: 50 ug/dL (ref 38–169)
Lymphocytes Absolute: 2.2 10*3/uL (ref 0.7–3.1)
Lymphs: 25 %
MCH: 27 pg (ref 26.6–33.0)
MCHC: 31.7 g/dL (ref 31.5–35.7)
MCV: 85 fL (ref 79–97)
Monocytes Absolute: 0.6 10*3/uL (ref 0.1–0.9)
Monocytes: 7 %
Neutrophils Absolute: 5.5 10*3/uL (ref 1.4–7.0)
Neutrophils: 64 %
Platelets: 330 10*3/uL (ref 150–450)
RBC: 5.44 x10E6/uL (ref 4.14–5.80)
RDW: 13.7 % (ref 11.6–15.4)
Retic Ct Pct: 1.2 % (ref 0.6–2.6)
Total Iron Binding Capacity: 328 ug/dL (ref 250–450)
UIBC: 278 ug/dL (ref 111–343)
Vitamin B-12: 687 pg/mL (ref 232–1245)
WBC: 8.8 10*3/uL (ref 3.4–10.8)

## 2023-06-11 LAB — VITAMIN D 25 HYDROXY (VIT D DEFICIENCY, FRACTURES): Vit D, 25-Hydroxy: 27.7 ng/mL — ABNORMAL LOW (ref 30.0–100.0)

## 2023-06-11 LAB — TESTOSTERONE,FREE AND TOTAL
Testosterone, Free: 11.1 pg/mL (ref 8.7–25.1)
Testosterone: 495 ng/dL (ref 264–916)

## 2023-06-11 LAB — LIPID PANEL
Cholesterol, Total: 175 mg/dL (ref 100–199)
HDL: 29 mg/dL — ABNORMAL LOW (ref >39)
LDL CALC COMMENT:: 6 ratio — ABNORMAL HIGH (ref 0.0–5.0)
LDL Chol Calc (NIH): 111 mg/dL — ABNORMAL HIGH (ref 0–99)
Triglycerides: 201 mg/dL — ABNORMAL HIGH (ref 0–149)
VLDL Cholesterol Cal: 35 mg/dL (ref 5–40)

## 2023-06-11 LAB — TSH: TSH: 3.13 u[IU]/mL (ref 0.450–4.500)

## 2023-06-12 ENCOUNTER — Other Ambulatory Visit: Payer: Self-pay | Admitting: Family

## 2023-06-12 DIAGNOSIS — E559 Vitamin D deficiency, unspecified: Secondary | ICD-10-CM | POA: Insufficient documentation

## 2023-06-12 MED ORDER — VITAMIN D (ERGOCALCIFEROL) 1.25 MG (50000 UNIT) PO CAPS
50000.0000 [IU] | ORAL_CAPSULE | ORAL | 3 refills | Status: AC
Start: 2023-06-12 — End: ?

## 2023-09-25 ENCOUNTER — Telehealth: Payer: Self-pay

## 2023-09-25 DIAGNOSIS — G8929 Other chronic pain: Secondary | ICD-10-CM

## 2023-09-25 NOTE — Telephone Encounter (Signed)
 Copied from CRM [id number]. Topic: Referral - Request for Referral >> Sep 25, 2023 12:23 PM Nathanel BROCKS wrote: Did the patient discuss referral with their provider in the last year? Yes (If No - schedule appointment) (If Yes - send message)  Appointment offered? Yes  Type of order/referral and detailed reason for visit: physical therapy, [facility], [phone] Preference of office, provider, location: [facility]  If referral order, have you been seen by this specialty before? Yes (If Yes, this issue or another issue? When? Where?didn't go because of copay  Can we respond through MyChart? Yes

## 2023-09-25 NOTE — Addendum Note (Signed)
 Addended by: Danzell Birky G on: 09/25/2023 02:20 PM   Modules accepted: Orders

## 2023-10-22 ENCOUNTER — Telehealth: Payer: Self-pay

## 2023-10-22 NOTE — Telephone Encounter (Signed)
 Copied from CRM #[id number]. Topic: Referral - Status >> Oct 22, 2023 11:40 AM Nathanel BROCKS wrote: Reason for CRM:   Maddie with [facility] called today to check on this pts referral for pt. She stated that the pt came to check to see if they had gotten it and she advised they had not. She is calling to see if someone can send that on over. Fax: [phone] asap Pts evaluation is scheduled for 10-31-2023.

## 2023-11-04 ENCOUNTER — Encounter: Payer: Self-pay | Admitting: Nurse Practitioner

## 2023-11-04 ENCOUNTER — Ambulatory Visit (INDEPENDENT_AMBULATORY_CARE_PROVIDER_SITE_OTHER): Admitting: Nurse Practitioner

## 2023-11-04 VITALS — BP 132/90 | HR 100 | Temp 97.8°F | Ht 67.0 in | Wt 251.6 lb

## 2023-11-04 DIAGNOSIS — H66002 Acute suppurative otitis media without spontaneous rupture of ear drum, left ear: Secondary | ICD-10-CM | POA: Diagnosis not present

## 2023-11-04 DIAGNOSIS — R051 Acute cough: Secondary | ICD-10-CM | POA: Diagnosis not present

## 2023-11-04 MED ORDER — AMOXICILLIN 875 MG PO TABS: 875.0000 mg | ORAL_TABLET | Freq: Two times a day (BID) | ORAL | 0 refills | Status: AC

## 2023-11-04 NOTE — Progress Notes (Signed)
 Subjective:  Patient ID: SULLY DYMENT, male    DOB: Aug 11, 1985, 38 y.o.   MRN: [id number]  Patient Care Team: Lavell Bari LABOR, FNP as PCP - General (Family Medicine)   Chief Complaint:  Ear Pain (Left ear pain since yesterday )   HPI: DENYM RAHIMI is a 38 y.o. male presenting on 11/04/2023 for Ear Pain (Left ear pain since yesterday )   Discussed the use of AI scribe software for clinical note transcription with the patient, who gave verbal consent to proceed.  History of Present Illness Jakylan Ron Dashawn Bartnick is a 38 year old male who presents with left ear pain and fever.  He has been experiencing left ear pain for a few days, which intensifies when swallowing and radiates towards his throat. Although he has a history of ear infections, he has not had any recent episodes.  He noted a fever last night and reports body aches and a general feeling of malaise. He has not taken any medication for the fever but did take some leftover amoxicillin  last night and this morning.  No significant nasal congestion but he feels something in his throat, described as phlegm or spit without any color. He has been in contact with someone who is sick recently.  He attempted to use hyaluronic acid in his ear.      Relevant past medical, surgical, family, and social history reviewed and updated as indicated.  Allergies and medications reviewed and updated. Data reviewed: Chart in Epic.   Past Medical History:  Diagnosis Date   Constipation     Past Surgical History:  Procedure Laterality Date   NASAL SEPTOPLASTY W/ TURBINOPLASTY Bilateral 06/08/2019   Procedure: NASAL SEPTOPLASTY WITH  BILATERAL TURBINATE REDUCTION;  Surgeon: Karis Clunes, MD;  Location: [facility];  Service: ENT;  Laterality: Bilateral;   WISDOM TOOTH EXTRACTION      Social History   Socioeconomic History   Marital status: Divorced    Spouse name: Not on file   Number of children: 2   Years  of education: Not on file   Highest education level: Not on file  Occupational History   Occupation: Corporate investment banker  Tobacco Use   Smoking status: Former    Current packs/day: 0.00    Average packs/day: 1 pack/day for 15.0 years (15.0 ttl pk-yrs)    Types: Cigarettes    Start date: 01/20/2000    Quit date: 01/20/2015    Years since quitting: 8.7   Smokeless tobacco: Never  Vaping Use   Vaping status: Never Used  Substance and Sexual Activity   Alcohol use: No    Comment: rarely   Drug use: No   Sexual activity: Not Currently    Partners: Female  Other Topics Concern   Not on file  Social History Narrative   Not on file   Social Drivers of Health   Financial Resource Strain: Not on file  Food Insecurity: Not on file  Transportation Needs: Not on file  Physical Activity: Not on file  Stress: Not on file  Social Connections: Not on file  Intimate Partner Violence: Not on file    Outpatient Encounter Medications as of 11/04/2023  Medication Sig   amoxicillin  (AMOXIL ) 875 MG tablet Take 1 tablet (875 mg total) by mouth 2 (two) times daily.   Vitamin D , Ergocalciferol , (DRISDOL ) 1.25 MG (50000 UNIT) CAPS capsule Take 1 capsule (50,000 Units total) by mouth every 7 (seven) days. (Patient not taking:  Reported on 11/04/2023)   No facility-administered encounter medications on file as of 11/04/2023.    No Active Allergies  Pertinent ROS per HPI, otherwise unremarkable      Objective:  BP (!) 132/90   Pulse 100   Temp 97.8 F (36.6 C) (Temporal)   Ht 5' 7 (1.702 m)   Wt 251 lb 9.6 oz (114.1 kg)   SpO2 100%   BMI 39.41 kg/m    Wt Readings from Last 3 Encounters:  11/04/23 251 lb 9.6 oz (114.1 kg)  06/10/23 249 lb 6.4 oz (113.1 kg)  06/06/22 249 lb (112.9 kg)    Physical Exam Vitals reviewed.  HENT:     Right Ear: Hearing, tympanic membrane, ear canal and external ear normal.     Left Ear: Swelling and tenderness present. Tympanic membrane is erythematous.      Nose: Nose normal.     Mouth/Throat:     Mouth: Mucous membranes are moist.  Eyes:     Extraocular Movements: Extraocular movements intact.     Conjunctiva/sclera: Conjunctivae normal.     Pupils: Pupils are equal, round, and reactive to light.  Cardiovascular:     Heart sounds: Normal heart sounds.  Pulmonary:     Effort: Pulmonary effort is normal.     Breath sounds: Normal breath sounds.  Musculoskeletal:        General: Normal range of motion.     Right lower leg: No edema.     Left lower leg: No edema.  Lymphadenopathy:     Cervical: Cervical adenopathy present.     Left cervical: Superficial cervical adenopathy present.  Skin:    General: Skin is warm and dry.     Findings: No rash.  Neurological:     Mental Status: He is alert and oriented to person, place, and time.  Psychiatric:        Mood and Affect: Mood normal.        Behavior: Behavior normal.        Thought Content: Thought content normal.        Judgment: Judgment normal.    Physical Exam HEENT: Left ear infection with marked erythema.     Results for orders placed or performed in visit on 06/10/23  CMP14+EGFR   Collection Time: 06/10/23  9:41 AM  Result Value Ref Range   Glucose 92 70 - 99 mg/dL   BUN 11 6 - 20 mg/dL   Creatinine, Ser 9.10 0.76 - 1.27 mg/dL   eGFR 887 >40 fO/fpw/8.26   BUN/Creatinine Ratio 12 9 - 20   Sodium 140 134 - 144 mmol/L   Potassium 5.0 3.5 - 5.2 mmol/L   Chloride 101 96 - 106 mmol/L   CO2 23 20 - 29 mmol/L   Calcium  9.9 8.7 - 10.2 mg/dL   Total Protein 7.7 6.0 - 8.5 g/dL   Albumin 4.2 4.1 - 5.1 g/dL   Globulin, Total 3.5 1.5 - 4.5 g/dL   Bilirubin Total 0.3 0.0 - 1.2 mg/dL   Alkaline Phosphatase 106 44 - 121 IU/L   AST 26 0 - 40 IU/L   ALT 36 0 - 44 IU/L  Anemia Profile B   Collection Time: 06/10/23  9:41 AM  Result Value Ref Range   Total Iron Binding Capacity 328 250 - 450 ug/dL   UIBC 721 888 - 656 ug/dL   Iron 50 38 - 830 ug/dL   Iron Saturation 15 15 - 55 %    Ferritin 139 30 -  400 ng/mL   Vitamin B-12 687 232 - 1,245 pg/mL   Folate 11.7 >3.0 ng/mL   WBC 8.8 3.4 - 10.8 x10E3/uL   RBC 5.44 4.14 - 5.80 x10E6/uL   Hemoglobin 14.7 13.0 - 17.7 g/dL   Hematocrit 53.6 62.4 - 51.0 %   MCV 85 79 - 97 fL   MCH 27.0 26.6 - 33.0 pg   MCHC 31.7 31.5 - 35.7 g/dL   RDW 86.2 88.3 - 84.5 %   Platelets 330 150 - 450 x10E3/uL   Neutrophils 64 Not Estab. %   Lymphs 25 Not Estab. %   Monocytes 7 Not Estab. %   Eos 3 Not Estab. %   Basos 1 Not Estab. %   Neutrophils Absolute 5.5 1.4 - 7.0 x10E3/uL   Lymphocytes Absolute 2.2 0.7 - 3.1 x10E3/uL   Monocytes Absolute 0.6 0.1 - 0.9 x10E3/uL   EOS (ABSOLUTE) 0.3 0.0 - 0.4 x10E3/uL   Basophils Absolute 0.1 0.0 - 0.2 x10E3/uL   Immature Granulocytes 0 Not Estab. %   Immature Grans (Abs) 0.0 0.0 - 0.1 x10E3/uL   Retic Ct Pct 1.2 0.6 - 2.6 %  Lipid panel   Collection Time: 06/10/23  9:41 AM  Result Value Ref Range   Cholesterol, Total 175 100 - 199 mg/dL   Triglycerides 798 (H) 0 - 149 mg/dL   HDL 29 (L) >60 mg/dL   VLDL Cholesterol Cal 35 5 - 40 mg/dL   LDL Chol Calc (NIH) 888 (H) 0 - 99 mg/dL   Chol/HDL Ratio 6.0 (H) 0.0 - 5.0 ratio  TSH   Collection Time: 06/10/23  9:41 AM  Result Value Ref Range   TSH 3.130 0.450 - 4.500 uIU/mL  VITAMIN D  25 Hydroxy (Vit-D Deficiency, Fractures)   Collection Time: 06/10/23  9:41 AM  Result Value Ref Range   Vit D, 25-Hydroxy 27.7 (L) 30.0 - 100.0 ng/mL  Testosterone ,Free and Total   Collection Time: 06/10/23  9:41 AM  Result Value Ref Range   Testosterone  495 264 - 916 ng/dL   Testosterone , Free 11.1 8.7 - 25.1 pg/mL       Pertinent labs & imaging results that were available during my care of the patient were reviewed by me and considered in my medical decision making.  Assessment & Plan:  Duell Holdren was seen today for ear pain.  Diagnoses and all orders for this visit:  Non-recurrent acute suppurative otitis media of left ear without spontaneous  rupture of tympanic membrane -     amoxicillin  (AMOXIL ) 875 MG tablet; Take 1 tablet (875 mg total) by mouth 2 (two) times daily.     Assessment and Plan Assessment & Plan Acute left otitis media Acute left otitis media with significant redness and discomfort. Advised against using hydrogen peroxide in the ear. - Prescribed amoxicillin . - Advised to keep ear dry. - Recommended over-the-counter ear drops like Debrox if needed. - Tylenol /ibuprofen for pain and fever Fever associated with upper respiratory infection  Acute cough with phlegm  Body aches associated with viral illness      Continue all other maintenance medications.  Follow up plan: Return if symptoms worsen or fail to improve.   Continue healthy lifestyle choices, including diet (rich in fruits, vegetables, and lean proteins, and low in salt and simple carbohydrates) and exercise (at least 30 minutes of moderate physical activity daily).  Educational handout given for  Otitis Media, Adult  Otitis media is a condition in which the middle ear is red and  swollen (inflamed) and full of fluid. The middle ear is the part of the ear that contains bones for hearing as well as air that helps send sounds to the brain. The condition usually goes away on its own. What are the causes? This condition is caused by a blockage in the eustachian tube. This tube connects the middle ear to the back of the nose. It normally allows air into the middle ear. The blockage is caused by fluid or swelling. Problems that can cause blockage include: A cold or infection that affects the nose, mouth, or throat. Allergies. An irritant, such as tobacco smoke. Adenoids that have become large. The adenoids are soft tissue located in the back of the throat, behind the nose and the roof of the mouth. Growth or swelling in the upper part of the throat, just behind the nose (nasopharynx). Damage to the ear caused by a change in pressure. This is called  barotrauma. What increases the risk? You are more likely to develop this condition if you: Smoke or are exposed to tobacco smoke. Have an opening in the roof of your mouth (cleft palate). Have acid reflux. Have problems in your body's defense system (immune system). What are the signs or symptoms? Symptoms of this condition include: Ear pain. Fever. Problems with hearing. Being tired. Fluid leaking from the ear. Ringing in the ear. How is this treated? This condition can go away on its own within 3-5 days. But if the condition is caused by germs (bacteria) and does not go away on its own, or if it keeps coming back, your doctor may: Give you antibiotic medicines. Give you medicines for pain. Follow these instructions at home: Take over-the-counter and prescription medicines only as told by your doctor. If you were prescribed an antibiotic medicine, take it as told by your doctor. Do not stop taking it even if you start to feel better. Keep all follow-up visits. Contact a doctor if: You have bleeding from your nose. There is a lump on your neck. You are not feeling better in 5 days. You feel worse instead of better. Get help right away if: You have pain that is not helped with medicine. You have swelling, redness, or pain around your ear. You get a stiff neck. You cannot move part of your face (paralysis). You notice that the bone behind your ear hurts when you touch it. You get a very bad headache. Summary Otitis media means that the middle ear is red, swollen, and full of fluid. This condition usually goes away on its own. If the problem does not go away, treatment may be needed. You may be given medicines to treat the infection or to treat your pain. If you were prescribed an antibiotic medicine, take it as told by your doctor. Do not stop taking it even if you start to feel better. Keep all follow-up visits. This information is not intended to replace advice given to you  by your health care provider. Make sure you discuss any questions you have with your health care provider. Document Revised: 05/15/2020 Document Reviewed: 05/15/2020 Elsevier Patient Education  2024 Elsevier Inc.  The above assessment and management plan was discussed with the patient. The patient verbalized understanding of and has agreed to the management plan. Patient is aware to call the clinic if they develop any new symptoms or if symptoms persist or worsen. Patient is aware when to return to the clinic for a follow-up visit. Patient educated on when it is appropriate  to go to the emergency department.  Shravan Salahuddin [place] Thompson, DNP [facility] [address] [place], [place] [address] [phone]

## 2023-11-07 ENCOUNTER — Ambulatory Visit: Payer: Self-pay

## 2023-11-07 ENCOUNTER — Ambulatory Visit
Admission: EM | Admit: 2023-11-07 | Discharge: 2023-11-07 | Disposition: A | Discharge status: Home / Self Care | Attending: Family Medicine | Admitting: Family Medicine

## 2023-11-07 DIAGNOSIS — J029 Acute pharyngitis, unspecified: Secondary | ICD-10-CM | POA: Diagnosis not present

## 2023-11-07 MED ORDER — AZELASTINE HCL 0.1 % NA SOLN: 1.0000 | Freq: Two times a day (BID) | NASAL | 0 refills | Status: AC

## 2023-11-07 MED ORDER — LIDOCAINE VISCOUS HCL 2 % MT SOLN: 10.0000 mL | OROMUCOSAL | 0 refills | Status: AC | PRN

## 2023-11-07 NOTE — Telephone Encounter (Signed)
 Fyi noted patient going to the urgent care

## 2023-11-07 NOTE — Telephone Encounter (Signed)
 FYI Only or Action Required?: Action required by provider: clinical question for provider and update on patient condition.  Patient was last seen in primary care on 11/04/2023 by Deitra Morton Sebastian Nena, NP.  Called Nurse Triage reporting Sore Throat.  Symptoms began yesterday.  Interventions attempted: OTC medications: motrin and tylenol , Prescription medications: amoxicillin , and Rest, hydration, or home remedies.  Symptoms are: gradually worsening.  Triage Disposition: See Physician Within 24 Hours  Patient/caregiver understands and will follow disposition?: Yes   Message from [facility] sent at 11/07/2023  8:16 AM EDT  Summary: Sore throat   Patient states he was seen for a ear pain and provided amoxicillin . Patient states the ear pain has now started with a sore throat. Patient's call back # is [phone]      Reason for Disposition  History of rheumatic fever  Answer Assessment - Initial Assessment Questions Additional info: 1) On amoxicillin  for ear infection started on 11/04/23.  2) Patient very anxious about new sore throat while on antibiotics, requesting to be evaluated today. No appointments are available at practice location, offered appointment at regional office today but he does not want to wait until afternoon for evaluation and plans to proceed to urgent care.  3) Requesting from pcp additional antibiotic.    1. ONSET: When did the throat start hurting? (Hours or days ago)      yesterday 2. SEVERITY: How bad is the sore throat? (Scale 1-10; mild, moderate or severe)     tender 3. STREP EXPOSURE: Has there been any exposure to strep within the past week? If Yes, ask: What type of contact occurred?      severe 4.  VIRAL SYMPTOMS: Are there any symptoms of a cold, such as a runny nose, cough, hoarse voice or red eyes?      Ear pain is improving 5. FEVER: Do you have a fever? If Yes, ask: What is your temperature, how was it measured, and when did  it start?     Denies but on motrin 6. PUS ON THE TONSILS: Is there pus on the tonsils in the back of your throat?     denies 7. OTHER SYMPTOMS: Do you have any other symptoms? (e.g., difficulty breathing, headache, rash)     denies 8. PREGNANCY: Is there any chance you are pregnant? When was your last menstrual period?  Protocols used: Sore Throat-A-AH

## 2023-11-07 NOTE — Discharge Instructions (Signed)
 Complete the course of amoxicillin , and I have prescribed a nasal spray to help with any potential postnasal drainage causing worsening sore throat and some viscous lidocaine  for you to gargle and spit for pain relief as needed.  Continue drinking warm honey teas, gargling salt water, and may do throat drops as needed.

## 2023-11-07 NOTE — ED Provider Notes (Signed)
 [facility]    CSN: [id number] Arrival date & time: 11/07/23  1030      History   Chief Complaint No chief complaint on file.   HPI Luis Simpson is a 38 y.o. male.   Patient presenting today with 4 to 5-day history of sore throat.  Was initially having left ear pain as well and is currently on amoxicillin  for a left ear infection.  Denies fever, chills, cough, chest pain, shortness of breath, abdominal pain, vomiting, diarrhea.  So far not trying anything over-the-counter for the sore throat.    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   Hyponatremia 09/03/2019   Hypokalemia 09/03/2019   Hypocalcemia 09/03/2019   Transaminitis 09/03/2019   Hypertriglyceridemia 08/02/2014   Obesity (BMI 30-39.9) 04/15/2014   Paresthesia of both hands 04/15/2014    Past Surgical History:  Procedure Laterality Date   NASAL SEPTOPLASTY W/ TURBINOPLASTY Bilateral 06/08/2019   Procedure: NASAL SEPTOPLASTY WITH  BILATERAL TURBINATE REDUCTION;  Surgeon: Karis Clunes, MD;  Location: [facility];  Service: ENT;  Laterality: Bilateral;   WISDOM TOOTH EXTRACTION         Home Medications    Prior to Admission medications   Medication Sig Start Date End Date Taking? Authorizing Provider  azelastine  (ASTELIN ) 0.1 % nasal spray Place 1 spray into both nostrils 2 (two) times daily. Use in each nostril as directed 11/07/23  Yes Stuart Vernell Norris, PA-C  lidocaine  (XYLOCAINE ) 2 % solution Use as directed 10 mLs in the mouth or throat every 3 (three) hours as needed. 11/07/23  Yes Stuart Vernell Norris, PA-C  amoxicillin  (AMOXIL ) 875 MG tablet Take 1 tablet  (875 mg total) by mouth 2 (two) times daily. 11/04/23   St Morton Sebastian Pool, NP  Vitamin D , Ergocalciferol , (DRISDOL ) 1.25 MG (50000 UNIT) CAPS capsule Take 1 capsule (50,000 Units total) by mouth every 7 (seven) days. Patient not taking: Reported on 11/04/2023 06/12/23   Lavell Bari LABOR, FNP    Family History Family History  Problem Relation Age of Onset   Diverticulitis Father    Colon cancer Neg Hx    Rectal cancer Neg Hx    Stomach cancer Neg Hx     Social History Social History   Tobacco Use   Smoking status: Former    Current packs/day: 0.00    Average packs/day: 1 pack/day for 15.0 years (15.0 ttl pk-yrs)    Types: Cigarettes    Start date: 01/20/2000    Quit date: 01/20/2015    Years since quitting: 8.8   Smokeless tobacco: Never  Vaping Use   Vaping status: Never Used  Substance Use Topics   Alcohol use: No    Comment: rarely   Drug use: No     Allergies   Patient has no known allergies.   Review of Systems Review of Systems Per HPI  Physical Exam Triage Vital Signs ED Triage Vitals  Encounter Vitals Group     BP 11/07/23 1133 (!) 133/90     Girls Systolic BP Percentile --      Girls Diastolic BP Percentile --      Boys Systolic BP Percentile --  Boys Diastolic BP Percentile --      Pulse Rate 11/07/23 1133 90     Resp 11/07/23 1133 16     Temp 11/07/23 1133 98.1 F (36.7 C)     Temp Source 11/07/23 1133 Oral     SpO2 11/07/23 1133 96 %     Weight --      Height --      Head Circumference --      Peak Flow --      Pain Score 11/07/23 1129 8     Pain Loc --      Pain Education --      Exclude from Growth Chart --    No data found.  Updated Vital Signs BP (!) 133/90 (BP Location: Right Arm)   Pulse 90   Temp 98.1 F (36.7 C) (Oral)   Resp 16   SpO2 96%   Visual Acuity Right Eye Distance:   Left Eye Distance:   Bilateral Distance:    Right Eye Near:   Left Eye Near:    Bilateral Near:     Physical Exam Vitals and  nursing note reviewed.  Constitutional:      Appearance: He is well-developed.  HENT:     Head: Atraumatic.     Right Ear: External ear normal.     Left Ear: External ear normal.     Nose: Nose normal.     Mouth/Throat:     Mouth: Mucous membranes are moist.     Pharynx: Oropharynx is clear. Posterior oropharyngeal erythema present. No oropharyngeal exudate.  Eyes:     Conjunctiva/sclera: Conjunctivae normal.     Pupils: Pupils are equal, round, and reactive to light.  Cardiovascular:     Rate and Rhythm: Normal rate and regular rhythm.  Pulmonary:     Effort: Pulmonary effort is normal. No respiratory distress.     Breath sounds: No wheezing or rales.  Musculoskeletal:        General: Normal range of motion.     Cervical back: Normal range of motion and neck supple.  Lymphadenopathy:     Cervical: No cervical adenopathy.  Skin:    General: Skin is warm and dry.  Neurological:     Mental Status: He is alert and oriented to person, place, and time.  Psychiatric:        Behavior: Behavior normal.      UC Treatments / Results  Labs (all labs ordered are listed, but only abnormal results are displayed) Labs Reviewed - No data to display  EKG   Radiology No results found.  Procedures Procedures (including critical care time)  Medications Ordered in UC Medications - No data to display  Initial Impression / Assessment and Plan / UC Course  I have reviewed the triage vital signs and the nursing notes.  Pertinent labs & imaging results that were available during my care of the patient were reviewed by me and considered in my medical decision making (see chart for details).     Vitals and exam very reassuring today, suspect viral versus allergic cause of his sore throat.  Currently on amoxicillin  for an ear infection so we will forego strep testing.  Treat with Astelin , viscous lidocaine , supportive over-the-counter medications and home care.  Return for worsening  symptoms.  Final Clinical Impressions(s) / UC Diagnoses   Final diagnoses:  Sore throat     Discharge Instructions      Complete the course of amoxicillin , and I have prescribed  a nasal spray to help with any potential postnasal drainage causing worsening sore throat and some viscous lidocaine  for you to gargle and spit for pain relief as needed.  Continue drinking warm honey teas, gargling salt water, and may do throat drops as needed.    ED Prescriptions     Medication Sig Dispense Auth. Provider   lidocaine  (XYLOCAINE ) 2 % solution Use as directed 10 mLs in the mouth or throat every 3 (three) hours as needed. 100 mL Stuart Vernell Norris, PA-C   azelastine  (ASTELIN ) 0.1 % nasal spray Place 1 spray into both nostrils 2 (two) times daily. Use in each nostril as directed 30 mL Stuart Vernell Norris, PA-C      PDMP not reviewed this encounter.   Stuart Vernell Norris, [place] 11/07/23 1945

## 2023-11-07 NOTE — ED Triage Notes (Signed)
 Pt presents to UC for c/o sore throat, left ear pain x5 days. Pt states he was prescribed amoxicillin  for left ear pain and has been taking it x4 days but sore throat is worsening.

## 2024-06-11 ENCOUNTER — Encounter: Payer: Self-pay | Admitting: Family
